# Patient Record
Sex: Female | Born: 1975 | Race: White | Hispanic: No | Smoking: Former smoker
Health system: Southern US, Community
[De-identification: ages and names within clinical notes are randomized; demographics above are authoritative.]

## PROBLEM LIST (undated history)

## (undated) DIAGNOSIS — R7989 Other specified abnormal findings of blood chemistry: Secondary | ICD-10-CM

## (undated) DIAGNOSIS — K219 Gastro-esophageal reflux disease without esophagitis: Secondary | ICD-10-CM

## (undated) DIAGNOSIS — E282 Polycystic ovarian syndrome: Secondary | ICD-10-CM

## (undated) DIAGNOSIS — R945 Abnormal results of liver function studies: Secondary | ICD-10-CM

## (undated) DIAGNOSIS — I10 Essential (primary) hypertension: Secondary | ICD-10-CM

## (undated) HISTORY — DX: Other specified abnormal findings of blood chemistry: R79.89

## (undated) HISTORY — DX: Abnormal results of liver function studies: R94.5

## (undated) HISTORY — DX: Essential (primary) hypertension: I10

## (undated) HISTORY — PX: PARTIAL HYSTERECTOMY: SHX80

---

## 1998-01-05 ENCOUNTER — Other Ambulatory Visit: Admission: RE | Admit: 1998-01-05 | Discharge: 1998-01-05 | Payer: Self-pay | Admitting: Obstetrics and Gynecology

## 1998-02-06 ENCOUNTER — Other Ambulatory Visit: Admission: RE | Admit: 1998-02-06 | Discharge: 1998-02-06 | Payer: Self-pay | Admitting: Obstetrics and Gynecology

## 1998-09-20 ENCOUNTER — Other Ambulatory Visit: Admission: RE | Admit: 1998-09-20 | Discharge: 1998-09-20 | Payer: Self-pay | Admitting: Obstetrics and Gynecology

## 1999-02-07 ENCOUNTER — Ambulatory Visit (HOSPITAL_COMMUNITY): Admission: RE | Admit: 1999-02-07 | Discharge: 1999-02-07 | Payer: Self-pay | Admitting: Obstetrics and Gynecology

## 1999-11-19 ENCOUNTER — Other Ambulatory Visit: Admission: RE | Admit: 1999-11-19 | Discharge: 1999-11-19 | Payer: Self-pay | Admitting: Obstetrics and Gynecology

## 2000-06-04 ENCOUNTER — Other Ambulatory Visit: Admission: RE | Admit: 2000-06-04 | Discharge: 2000-06-04 | Payer: Self-pay | Admitting: Obstetrics and Gynecology

## 2001-04-20 ENCOUNTER — Inpatient Hospital Stay (HOSPITAL_COMMUNITY): Admission: AD | Admit: 2001-04-20 | Discharge: 2001-04-20 | Payer: Self-pay | Admitting: Obstetrics and Gynecology

## 2001-05-29 ENCOUNTER — Inpatient Hospital Stay (HOSPITAL_COMMUNITY): Admission: AD | Admit: 2001-05-29 | Discharge: 2001-05-29 | Payer: Self-pay | Admitting: *Deleted

## 2001-05-30 ENCOUNTER — Encounter (INDEPENDENT_AMBULATORY_CARE_PROVIDER_SITE_OTHER): Payer: Self-pay

## 2001-05-30 ENCOUNTER — Inpatient Hospital Stay (HOSPITAL_COMMUNITY): Admission: AD | Admit: 2001-05-30 | Discharge: 2001-06-02 | Payer: Self-pay | Admitting: *Deleted

## 2001-06-07 ENCOUNTER — Inpatient Hospital Stay (HOSPITAL_COMMUNITY): Admission: AD | Admit: 2001-06-07 | Discharge: 2001-06-07 | Payer: Self-pay | Admitting: Obstetrics and Gynecology

## 2001-06-09 ENCOUNTER — Encounter: Admission: RE | Admit: 2001-06-09 | Discharge: 2001-07-09 | Payer: Self-pay | Admitting: Obstetrics and Gynecology

## 2001-07-06 ENCOUNTER — Other Ambulatory Visit: Admission: RE | Admit: 2001-07-06 | Discharge: 2001-07-06 | Payer: Self-pay | Admitting: Obstetrics and Gynecology

## 2002-03-07 ENCOUNTER — Other Ambulatory Visit: Admission: RE | Admit: 2002-03-07 | Discharge: 2002-03-07 | Payer: Self-pay | Admitting: Obstetrics and Gynecology

## 2002-05-04 ENCOUNTER — Inpatient Hospital Stay (HOSPITAL_COMMUNITY): Admission: AD | Admit: 2002-05-04 | Discharge: 2002-05-04 | Payer: Self-pay | Admitting: Obstetrics and Gynecology

## 2002-07-09 ENCOUNTER — Inpatient Hospital Stay (HOSPITAL_COMMUNITY): Admission: AD | Admit: 2002-07-09 | Discharge: 2002-07-09 | Payer: Self-pay | Admitting: Obstetrics and Gynecology

## 2002-07-11 ENCOUNTER — Inpatient Hospital Stay (HOSPITAL_COMMUNITY): Admission: AD | Admit: 2002-07-11 | Discharge: 2002-07-11 | Payer: Self-pay | Admitting: Obstetrics and Gynecology

## 2002-08-19 ENCOUNTER — Inpatient Hospital Stay (HOSPITAL_COMMUNITY): Admission: AD | Admit: 2002-08-19 | Discharge: 2002-08-19 | Payer: Self-pay | Admitting: Obstetrics and Gynecology

## 2002-08-23 ENCOUNTER — Inpatient Hospital Stay (HOSPITAL_COMMUNITY): Admission: AD | Admit: 2002-08-23 | Discharge: 2002-08-27 | Payer: Self-pay | Admitting: Obstetrics and Gynecology

## 2002-08-28 ENCOUNTER — Encounter: Admission: RE | Admit: 2002-08-28 | Discharge: 2002-09-27 | Payer: Self-pay | Admitting: Obstetrics and Gynecology

## 2002-09-27 ENCOUNTER — Other Ambulatory Visit: Admission: RE | Admit: 2002-09-27 | Discharge: 2002-09-27 | Payer: Self-pay | Admitting: Obstetrics and Gynecology

## 2002-09-28 ENCOUNTER — Encounter: Admission: RE | Admit: 2002-09-28 | Discharge: 2002-10-28 | Payer: Self-pay | Admitting: Obstetrics and Gynecology

## 2002-11-27 ENCOUNTER — Encounter: Admission: RE | Admit: 2002-11-27 | Discharge: 2002-12-27 | Payer: Self-pay | Admitting: Obstetrics and Gynecology

## 2002-12-27 ENCOUNTER — Encounter: Admission: RE | Admit: 2002-12-27 | Discharge: 2003-03-27 | Payer: Self-pay | Admitting: Family Medicine

## 2003-01-27 ENCOUNTER — Encounter: Admission: RE | Admit: 2003-01-27 | Discharge: 2003-02-26 | Payer: Self-pay | Admitting: Obstetrics and Gynecology

## 2003-03-29 ENCOUNTER — Encounter: Admission: RE | Admit: 2003-03-29 | Discharge: 2003-04-28 | Payer: Self-pay | Admitting: Obstetrics and Gynecology

## 2003-07-17 ENCOUNTER — Inpatient Hospital Stay (HOSPITAL_COMMUNITY): Admission: AD | Admit: 2003-07-17 | Discharge: 2003-07-19 | Payer: Self-pay | Admitting: *Deleted

## 2003-11-06 ENCOUNTER — Other Ambulatory Visit: Admission: RE | Admit: 2003-11-06 | Discharge: 2003-11-06 | Payer: Self-pay | Admitting: *Deleted

## 2004-01-02 ENCOUNTER — Ambulatory Visit (HOSPITAL_COMMUNITY): Admission: RE | Admit: 2004-01-02 | Discharge: 2004-01-02 | Payer: Self-pay | Admitting: Obstetrics and Gynecology

## 2004-07-01 ENCOUNTER — Emergency Department (HOSPITAL_COMMUNITY): Admission: EM | Admit: 2004-07-01 | Discharge: 2004-07-01 | Payer: Self-pay | Admitting: Emergency Medicine

## 2005-02-27 ENCOUNTER — Other Ambulatory Visit: Admission: RE | Admit: 2005-02-27 | Discharge: 2005-02-27 | Payer: Self-pay | Admitting: *Deleted

## 2009-05-22 ENCOUNTER — Inpatient Hospital Stay (HOSPITAL_COMMUNITY): Admission: EM | Admit: 2009-05-22 | Discharge: 2009-05-27 | Payer: Self-pay | Admitting: Emergency Medicine

## 2009-05-25 ENCOUNTER — Ambulatory Visit: Payer: Self-pay | Admitting: Infectious Diseases

## 2010-02-21 DIAGNOSIS — R87612 Low grade squamous intraepithelial lesion on cytologic smear of cervix (LGSIL): Secondary | ICD-10-CM | POA: Insufficient documentation

## 2010-05-18 HISTORY — PX: ABDOMINAL HYSTERECTOMY: SHX81

## 2010-05-27 ENCOUNTER — Ambulatory Visit (HOSPITAL_COMMUNITY): Admission: RE | Admit: 2010-05-27 | Discharge: 2010-05-28 | Payer: Self-pay | Admitting: Obstetrics and Gynecology

## 2010-05-27 ENCOUNTER — Encounter (INDEPENDENT_AMBULATORY_CARE_PROVIDER_SITE_OTHER): Payer: Self-pay | Admitting: Obstetrics and Gynecology

## 2010-10-30 LAB — CBC
HCT: 33.3 % — ABNORMAL LOW (ref 36.0–46.0)
HCT: 42.3 % (ref 36.0–46.0)
Hemoglobin: 11.4 g/dL — ABNORMAL LOW (ref 12.0–15.0)
Hemoglobin: 14.6 g/dL (ref 12.0–15.0)
MCH: 30.7 pg (ref 26.0–34.0)
MCH: 30.8 pg (ref 26.0–34.0)
MCHC: 34.4 g/dL (ref 30.0–36.0)
MCHC: 34.5 g/dL (ref 30.0–36.0)
MCV: 88.9 fL (ref 78.0–100.0)
MCV: 89.4 fL (ref 78.0–100.0)
Platelets: 229 10*3/uL (ref 150–400)
Platelets: 284 10*3/uL (ref 150–400)
RBC: 3.72 MIL/uL — ABNORMAL LOW (ref 3.87–5.11)
RBC: 4.76 MIL/uL (ref 3.87–5.11)
RDW: 13.2 % (ref 11.5–15.5)
RDW: 13.3 % (ref 11.5–15.5)
WBC: 13.1 10*3/uL — ABNORMAL HIGH (ref 4.0–10.5)
WBC: 8.6 10*3/uL (ref 4.0–10.5)

## 2010-10-30 LAB — SURGICAL PCR SCREEN
MRSA, PCR: NEGATIVE
Staphylococcus aureus: NEGATIVE

## 2010-10-30 LAB — PREGNANCY, URINE: Preg Test, Ur: NEGATIVE

## 2010-11-07 ENCOUNTER — Encounter: Payer: Self-pay | Admitting: Gastroenterology

## 2010-11-14 NOTE — Letter (Signed)
Summary: New Patient letter  [facility]  [address] [place], [place] [address]   Phone: [phone]  Fax: [phone]       11/07/2010 MRN: [id number]  [facility] [address] [place], [place]  [address]  Dear Ms. Laruth Bouchard,  Welcome to the Gastroenterology Division at [facility].    You are scheduled to see Dr.  Christella Hartigan on 12-18-10 at 3:30pm on the 3rd floor at [facility], [address]  We ask that you try to arrive at our office 15 minutes prior to your appointment time to allow for check-in.  We would like you to complete the enclosed self-administered evaluation form prior to your visit and bring it with you on the day of your appointment.  We will review it with you.  Also, please bring a complete list of all your medications or, if you prefer, bring the medication bottles and we will list them.  Please bring your insurance card so that we may make a copy of it.  If your insurance requires a referral to see a specialist, please bring your referral form from your primary care physician.  Co-payments are due at the time of your visit and may be paid by cash, check or credit card.     Your office visit will consist of a consult with your physician (includes a physical exam), any laboratory testing he/she may order, scheduling of any necessary diagnostic testing (e.g. x-ray, ultrasound, CT-scan), and scheduling of a procedure (e.g. Endoscopy, Colonoscopy) if required.  Please allow enough time on your schedule to allow for any/all of these possibilities.    If you cannot keep your appointment, please call [phone] to cancel or reschedule prior to your appointment date.  This allows [place] the opportunity to schedule an appointment for another patient in need of care.  If you do not cancel or reschedule by 5 p.m. the business day prior to your appointment date, you will be charged a $50.00 late cancellation/no-show fee.    Thank you for choosing [facility] for your medical needs.  We appreciate the opportunity to care for you.  Please visit [place] at our website  to learn more about our practice.                     Sincerely,                                                             The Gastroenterology Division

## 2010-11-21 LAB — CBC
HCT: 35.4 % — ABNORMAL LOW (ref 36.0–46.0)
HCT: 40.5 % (ref 36.0–46.0)
HCT: 41.9 % (ref 36.0–46.0)
Hemoglobin: 12.1 g/dL (ref 12.0–15.0)
Hemoglobin: 13.2 g/dL (ref 12.0–15.0)
Hemoglobin: 14.1 g/dL (ref 12.0–15.0)
MCHC: 33.6 g/dL (ref 30.0–36.0)
MCHC: 34.1 g/dL (ref 30.0–36.0)
MCHC: 34.6 g/dL (ref 30.0–36.0)
MCV: 89.6 fL (ref 78.0–100.0)
MCV: 90.3 fL (ref 78.0–100.0)
MCV: 90.5 fL (ref 78.0–100.0)
Platelets: 269 10*3/uL (ref 150–400)
Platelets: 286 10*3/uL (ref 150–400)
RBC: 3.91 MIL/uL (ref 3.87–5.11)
RBC: 4.28 MIL/uL (ref 3.87–5.11)
RBC: 4.52 MIL/uL (ref 3.87–5.11)
RBC: 4.64 MIL/uL (ref 3.87–5.11)
RDW: 13.4 % (ref 11.5–15.5)
WBC: 11.2 10*3/uL — ABNORMAL HIGH (ref 4.0–10.5)
WBC: 16.3 10*3/uL — ABNORMAL HIGH (ref 4.0–10.5)
WBC: 8.3 10*3/uL (ref 4.0–10.5)

## 2010-11-21 LAB — BASIC METABOLIC PANEL
BUN: 11 mg/dL (ref 6–23)
BUN: 6 mg/dL (ref 6–23)
CO2: 24 mEq/L (ref 19–32)
CO2: 28 mEq/L (ref 19–32)
Calcium: 8.8 mg/dL (ref 8.4–10.5)
Calcium: 8.8 mg/dL (ref 8.4–10.5)
Chloride: 103 mEq/L (ref 96–112)
Chloride: 104 mEq/L (ref 96–112)
Creatinine, Ser: 0.74 mg/dL (ref 0.4–1.2)
Creatinine, Ser: 0.77 mg/dL (ref 0.4–1.2)
GFR calc Af Amer: >60 mL/min (ref >60)
GFR calc Af Amer: >60 mL/min (ref >60)
GFR calc Af Amer: >60 mL/min (ref >60)
GFR calc non Af Amer: >60 mL/min (ref >60)
GFR calc non Af Amer: >60 mL/min (ref >60)
Glucose, Bld: 102 mg/dL — ABNORMAL HIGH (ref 70–99)
Potassium: 3.8 mEq/L (ref 3.5–5.1)
Potassium: 4.3 mEq/L (ref 3.5–5.1)
Sodium: 136 mEq/L (ref 135–145)
Sodium: 136 mEq/L (ref 135–145)
Sodium: 138 mEq/L (ref 135–145)

## 2010-11-21 LAB — ANTI-NUCLEAR AB-TITER (ANA TITER)

## 2010-11-21 LAB — CSF CULTURE W GRAM STAIN: Culture: NO GROWTH

## 2010-11-21 LAB — DIFFERENTIAL
Basophils Absolute: 0 10*3/uL (ref 0.0–0.1)
Basophils Relative: 0 % (ref 0–1)
Eosinophils Absolute: 0.1 10*3/uL (ref 0.0–0.7)
Eosinophils Relative: 1 % (ref 0–5)
Lymphocytes Relative: 9 % — ABNORMAL LOW (ref 12–46)
Lymphs Abs: 1 10*3/uL (ref 0.7–4.0)
Monocytes Absolute: 0.5 10*3/uL (ref 0.1–1.0)
Monocytes Relative: 5 % (ref 3–12)
Neutro Abs: 9.6 10*3/uL — ABNORMAL HIGH (ref 1.7–7.7)
Neutrophils Relative %: 85 % — ABNORMAL HIGH (ref 43–77)

## 2010-11-21 LAB — CSF CELL COUNT WITH DIFFERENTIAL
Eosinophils, CSF: 0 % (ref 0–1)
Eosinophils, CSF: 0 % (ref 0–1)
Lymphs, CSF: 91 % — ABNORMAL HIGH (ref 40–80)
Lymphs, CSF: 97 % — ABNORMAL HIGH (ref 40–80)
Monocyte-Macrophage-Spinal Fluid: 3 % — ABNORMAL LOW (ref 15–45)
Monocyte-Macrophage-Spinal Fluid: 7 % — ABNORMAL LOW (ref 15–45)
Other Cells, CSF: 0
Other Cells, CSF: 0
RBC Count, CSF: 16 /mm3 — ABNORMAL HIGH
RBC Count, CSF: 625 /mm3 — ABNORMAL HIGH
Segmented Neutrophils-CSF: 0 % (ref 0–6)
Segmented Neutrophils-CSF: 2 % (ref 0–6)
Tube #: 1
Tube #: 4
WBC, CSF: 179 /mm3 — ABNORMAL HIGH (ref 0–5)
WBC, CSF: 640 /mm3 — ABNORMAL HIGH (ref 0–5)

## 2010-11-21 LAB — URINALYSIS, ROUTINE W REFLEX MICROSCOPIC
Bilirubin Urine: NEGATIVE
Glucose, UA: NEGATIVE mg/dL
Hgb urine dipstick: NEGATIVE
Ketones, ur: NEGATIVE mg/dL
Nitrite: NEGATIVE
Protein, ur: NEGATIVE mg/dL
Specific Gravity, Urine: 1.014 (ref 1.005–1.030)
Urobilinogen, UA: 0.2 mg/dL (ref 0.0–1.0)
pH: 8 (ref 5.0–8.0)

## 2010-11-21 LAB — CRYPTOCOCCAL ANTIGEN, CSF

## 2010-11-21 LAB — PREGNANCY, URINE: Preg Test, Ur: NEGATIVE

## 2010-11-21 LAB — HSV PCR
HSV 2 , PCR: NOT DETECTED
HSV, PCR: NOT DETECTED

## 2010-11-21 LAB — GLUCOSE, CSF: Glucose, CSF: 48 mg/dL (ref 43–76)

## 2010-11-21 LAB — MISCELLANEOUS TEST

## 2010-11-21 LAB — PATHOLOGIST SMEAR REVIEW

## 2010-11-21 LAB — ROCKY MTN SPOTTED FVR AB, IGG-BLOOD: RMSF IgG: 0.3 IV

## 2010-11-21 LAB — ROCKY MTN SPOTTED FVR AB, IGM-BLOOD: RMSF IgM: 0.45 IV (ref 0.00–0.89)

## 2010-11-21 LAB — PROTEIN, CSF: Total  Protein, CSF: 129 mg/dL — ABNORMAL HIGH (ref 15–45)

## 2010-11-21 LAB — HIV-1 RNA QUANT-NO REFLEX-BLD
HIV 1 RNA Quant: <48 copies/mL (ref <48)
HIV-1 RNA Quant, Log: <1.68 {Log} (ref <1.68)

## 2010-12-18 ENCOUNTER — Other Ambulatory Visit: Payer: Self-pay | Admitting: Gastroenterology

## 2010-12-18 ENCOUNTER — Other Ambulatory Visit (INDEPENDENT_AMBULATORY_CARE_PROVIDER_SITE_OTHER): Payer: BC Managed Care – PPO

## 2010-12-18 ENCOUNTER — Ambulatory Visit (INDEPENDENT_AMBULATORY_CARE_PROVIDER_SITE_OTHER): Payer: BC Managed Care – PPO | Admitting: Gastroenterology

## 2010-12-18 ENCOUNTER — Encounter: Payer: Self-pay | Admitting: Gastroenterology

## 2010-12-18 VITALS — BP 136/84 | HR 90 | Ht 66.0 in | Wt 200.0 lb

## 2010-12-18 DIAGNOSIS — R7989 Other specified abnormal findings of blood chemistry: Secondary | ICD-10-CM

## 2010-12-18 LAB — CBC WITH DIFFERENTIAL/PLATELET
Basophils Absolute: 0 10*3/uL (ref 0.0–0.1)
HCT: 40.3 % (ref 36.0–46.0)
Lymphs Abs: 2 10*3/uL (ref 0.7–4.0)
MCV: 88.3 fl (ref 78.0–100.0)
Monocytes Absolute: 0.5 10*3/uL (ref 0.1–1.0)
Neutrophils Relative %: 63.6 % (ref 43.0–77.0)
Platelets: 344 10*3/uL (ref 150.0–400.0)
RDW: 13.6 % (ref 11.5–14.6)
WBC: 7.5 10*3/uL (ref 4.5–10.5)

## 2010-12-18 LAB — COMPREHENSIVE METABOLIC PANEL
ALT: 87 U/L — ABNORMAL HIGH (ref 0–35)
AST: 48 U/L — ABNORMAL HIGH (ref 0–37)
Albumin: 4.2 g/dL (ref 3.5–5.2)
Alkaline Phosphatase: 61 U/L (ref 39–117)
Potassium: 3.9 mEq/L (ref 3.5–5.1)
Sodium: 140 mEq/L (ref 135–145)
Total Bilirubin: 0.9 mg/dL (ref 0.3–1.2)
Total Protein: 7.1 g/dL (ref 6.0–8.3)

## 2010-12-18 LAB — IRON AND TIBC
%SAT: 12 % — ABNORMAL LOW (ref 20–55)
Iron: 45 ug/dL (ref 42–145)
TIBC: 363 ug/dL (ref 250–470)
UIBC: 318 ug/dL

## 2010-12-18 NOTE — Patient Instructions (Addendum)
We will get records from Dr. Ricki Miller (hepatitis panel, ANA recently) You will get labs drawn today:  total iron, ferritin, TIBC, ANA, AMA, anti smooth muscle antibody, alpha 1 antitrypsin, cerulloplasm, celiac sprue panel, total IgA level, CBC, CMET, INR. You will be set up for an ultrasound.  [facility] 12/23/10 arrive 7:45 am nothing to eat or drink after midnight. You may need EGD, colonoscopy depending on the workup above. We will be testing you for celiac sprue.

## 2010-12-18 NOTE — Progress Notes (Signed)
HPI: This is a  very is a 35 year old woman  Recently found to have elevated liver tests (AST was 57, ALT was 126. The rest of her liver tests as well as amylase and lipase were all normal. Her CBC was completely normal. Repeat check of liver tests showed a similar pattern about one week later.. Never had jaundice, hepatitis.  Never had blood transfusion.  No liver disease in family.  Never used IV drugs.  She has gained 30 pounds in past year, unintentionally.  Has post prandial bloating, discomfort for many years.   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 She had upper and lower endoscopy by Dr. Juanda Chance 10-12 years ago. Those charts are off site and not available for review at this time    Was given pancreatic enzymes recently, didn't make a difference.  She stopped them.   she has chronically loose stools, 4-5 a day. She has never seen blood in her stool   Review of systems: Pertinent positive and negative review of systems were noted in the above HPI section.  All other review of systems was otherwise negative.   Past Medical History, Past Surgical History, Family History, Social History, Current Medications, Allergies were all reviewed with the patient via [facility] electronic medical record system.   Physical Exam: BP 136/84  Pulse 90  Ht 5\' 6"  (1.676 m)  Wt 200 lb (90.719 kg)  BMI 32.28 kg/m2 Constitutional: generally well-appearing Psychiatric: alert and oriented x3 Eyes: extraocular movements intact Mouth: oral pharynx moist, no lesions Neck: supple no lymphadenopathy Cardiovascular: heart regular rate and rhythm Lungs: clear to auscultation bilaterally Abdomen: soft, nontender, nondistended, no obvious ascites, no peritoneal signs, normal bowel sounds Extremities: no lower extremity edema bilaterally Skin: no lesions on visible extremities    Assessment and plan: 35 y.o. female withElevated liver tests, postprandial bloating, chronic loose  stools  She tells me she had hepatitis panel done by her primary care physician we will get those results sent over. I will also send her for other blood work to check for other causes of elevated transaminases. I suspect that she has fatty liver disease especially given her 30 pound weight gain in the past 6 months. She has chronic postprandial bloating, chronic loose stools and perhaps these are signs of underlying celiac sprue which is also known to cause elevated liver tests. I will include a celiac panel and her workup. She might end up needing upper and lower endoscopies depending on the above workup. She will get an abdominal ultrasound for her liver tests as well.

## 2010-12-19 LAB — GLIA (IGA/G) + TTG IGA: Tissue Transglutaminase Ab, IgA: 6.3 U/mL (ref <20)

## 2010-12-19 LAB — ALPHA-1-ANTITRYPSIN: A-1 Antitrypsin, Ser: 94 mg/dL (ref 90–200)

## 2010-12-23 ENCOUNTER — Ambulatory Visit (HOSPITAL_COMMUNITY)
Admission: RE | Admit: 2010-12-23 | Discharge: 2010-12-23 | Disposition: A | Discharge status: Home / Self Care | Payer: BC Managed Care – PPO | Source: Ambulatory Visit | Attending: Gastroenterology | Admitting: Gastroenterology

## 2010-12-23 DIAGNOSIS — R141 Gas pain: Secondary | ICD-10-CM | POA: Insufficient documentation

## 2010-12-23 DIAGNOSIS — R7989 Other specified abnormal findings of blood chemistry: Secondary | ICD-10-CM | POA: Insufficient documentation

## 2010-12-23 DIAGNOSIS — R142 Eructation: Secondary | ICD-10-CM | POA: Insufficient documentation

## 2010-12-23 DIAGNOSIS — R143 Flatulence: Secondary | ICD-10-CM | POA: Insufficient documentation

## 2010-12-31 ENCOUNTER — Telehealth: Payer: Self-pay | Admitting: Gastroenterology

## 2010-12-31 DIAGNOSIS — R7989 Other specified abnormal findings of blood chemistry: Secondary | ICD-10-CM

## 2010-12-31 NOTE — Telephone Encounter (Signed)
Lab tests dated March, 2012;  Hepatitis a antibody, IgM was negative. Hepatitis B surface antigen was negative. Hepatitis B core antibody IgM was negative. Hepatitis C antibody was negative.   Please call her overall from reviewing her lab tests and her ultrasound I do think that she has fatty liver disease especially given her 30 pound weight in the past 6 months or so. She needs to try to lose this weight, slowly and gradually over the next year or 2. I would like to get a repeat set of LFTs in 2 months. If they show a significant increase from their current level then we might need to consider liver biopsy.

## 2010-12-31 NOTE — Telephone Encounter (Signed)
Pt aware she will have labs in July.

## 2011-01-01 ENCOUNTER — Telehealth: Payer: Self-pay | Admitting: Gastroenterology

## 2011-01-01 NOTE — Telephone Encounter (Signed)
Pt wanted to know if she should start the pancreatic enzymes I read her note to her that Dr Christella Hartigan has she told him they did not work so she stopped.  If they dont work she doesn't need to take them.  He did not advise her to restart

## 2011-01-03 NOTE — Op Note (Signed)
[facility]  Patient:    Amanda Stokes, Amanda Stokes Visit Number: [id number] MRN: [id number]          Service Type: OBS Location: 910A 9101 01 Attending Physician:  Donne Hazel Dictated by:   Guy Sandifer Arleta Creek, M.D. Proc. Date: 05/31/01 Admit Date:  05/30/2001                             Operative Report  DELIVERY NOTE  OBSTETRICIAN:                 Guy Sandifer. Arleta Creek, M.D.  DESCRIPTION OF PROCEDURE:     The patient made steady progress in the second stage.  The baby was in the occiput anterior position.  A second-degree midline episiotomy was performed.  She then had spontaneous delivery of the vertex.  The oropharynx and nasopharynx were then bulb suctioned.  A nuchal cord x 1 was reduced.  There was a mild shoulder dystocia treated with McRoberts position and suprapubic pressure, which resolved without a great deal of trouble.  The infant was then delivered.  The cord was clamped and cut and the infant was taken to the warming table.  Heart rate remained good and the infant responded well to stimulation.  A viable female infant was noted with Apgars of 7 and 9.  The placenta was intact with three vessels.  Birth weight was 9 lb 0 oz.  Arterial cord pH of 7.26 was noted.  The second-degree midline episiotomy which went down to the capsule of the rectal sphincter, but not through the muscle was repaired in layers.  There was thin meconium noted after delivery of the baby in the after coming fluid.  The oropharynx and nasopharynx were thoroughly bulb suctioned.  The patient and infant were stable in the labor and delivery room. Dictated by:   Guy Sandifer Arleta Creek, M.D. Attending Physician:  Donne Hazel DD:  05/31/01 TD:  05/31/01 Job: [id number]

## 2011-01-03 NOTE — Discharge Summary (Signed)
NAME:  VERTA, RIEDLINGER                             ACCOUNT NO.:  [id number]   MEDICAL RECORD NO.:  [id number]                   PATIENT TYPE:  INP   LOCATION:  9306                                 [facility]ILITY[facility]   PHYSICIAN:  Duke Salvia. Marcelle Overlie, M.D.            DATE OF BIRTH:  09/28/1975   DATE OF ADMISSION:  07/17/2003  DATE OF DISCHARGE:                                 DISCHARGE SUMMARY   DISCHARGE DIAGNOSIS:  Acute abdominal pain, probable viral syndrome,  possible mesenteric lymphadenitis.   SUMMARY OF THE ADMISSION HISTORY AND PHYSICAL EXAMINATION:  Please see  admission H&P for details.  Briefly, a 35 year old G2 P1 with one-day  history of right lower quadrant abdominal pain associated with nausea and  vomiting.   HOSPITAL COURSE:  The patient was admitted for observation after CT scan  showed no evidence of acute process in the abdomen, in particular no  evidence of appendicitis.  She does have an IUD and STD culture was obtained  and she was started on IV Unasyn as a precaution although her presentation  was not necessarily consistent with PID.  She was hydrated and given IV pain  medicines along with Zofran.  Her white count following the day had  decreased from 19,000 to 9.2; on the day of discharge - July 19, 2003 -  was 4.8 with a hemoglobin of 11.3.  Her diet was advanced.  On the day of  discharge she was afebrile, her pain was much improved, her abdominal exam  was unremarkable, she was hungry and tolerating a regular diet at that point  and ready for discharge.  She did have an episode the day prior to discharge  of diarrhea; some of these symptoms consistent with a mesenteric  lymphadenitis or gastroenteritis.   OTHER LABORATORY DATA:  CMET on admission:  Potassium 3.2, glucose 105,  calcium 7.9, albumin 3.2, total bilirubin 2.1, amylase was low at 18,  alkaline phosphatase was 34.  STD culture is pending.   DISPOSITION:  The patient discharged on Darvocet-N 100  one or two p.o. q.4-  6h. p.r.n., Zofran 8 mg ODT one p.o. q.6-8h. for nausea.  Advised to push  p.o. fluids at home.  We will call her to come into our office next week for  follow-up exam.   CONDITION:  Good.   ACTIVITY:  Normal.                                               Duke Salvia. Marcelle Overlie, M.D.    RMH/MEDQ  D:  07/19/2003  T:  07/19/2003  Job:  [id number]

## 2011-01-03 NOTE — Op Note (Signed)
[facility]  Patient:    Amanda Stokes Visit Number: [id number] MRN: [id number]          Service Type: DSU Location: [facility] Attending Physician:  Rhina Brackett Proc. Date: 02/07/99 Admit Date:  02/07/1999                             Operative Report  PREOPERATIVE DIAGNOSIS:       Chronic pelvic pain.  POSTOPERATIVE DIAGNOSIS:      Chronic pelvic pain.  PROCEDURE:                    Diagnostic laparoscopy.  SURGEON:                      Duke Salvia. Marcelle Overlie, M.D.  ANESTHESIA:                   General endotracheal.  COMPLICATIONS:                None.  DRAINS:                       In-and-out Foley catheter.  BLOOD LOSS:                   Less than 5 cc.  PROCEDURE AND FINDINGS:       Patient was taken to the operating room and after an adequate level of general endotracheal anesthesia was obtained, with the legs in stirrups, the abdomen, perineum and vagina were prepped and draped in the usual  manner for a laparoscopy.  Bladder was drained and EUA carried.  Uterus was midposition, normal size, mobile.  Adnexa negative.  Hulka tenaculum was positioned and attention directed to the abdomen where a 2-cm subumbilical incision was made and the Veress needle was introduced without difficulty; its intra-abdominal position was verified by pressure and water-testing.  After a two-liter pneumoperitoneum was then created, laparoscopic trocar and sleeve were then inserted without difficulty.  There was no evidence of any bleeding or trauma. Two to three fingerbreadths above the symphysis in the midline, a second puncture was made under direct visualization and a blunt probe was inserted.  With the uterus anteflexed and the patient in Trendelenburg, the pelvic findings are as follows:                                The anterior and posterior cul-de-sac were unremarkable.  The uterosacral ligaments were well-defined.  There was  no evidence of any endometriosis or scarring.  The uterus itself was normal size; the serosa was normal.  Tubes and ovaries were normal on either side.  No evidence of any endometriosis or peri-adnexal adhesions.  The appendix, cecum, liver edge, gallbladder, upper abdomen were unremarkable; no abnormalities that I could ascertain.  These findings were photo-documented.  Instruments were removed; gas allowed to escape.  The defects were closed with 4-0 Dexon subcuticular sutures. She tolerated this well and went to recovery room in good condition. Attending Physician:  Rhina Brackett DD:  02/07/99 TD:  02/07/99 Job: [id number]

## 2011-01-03 NOTE — H&P (Signed)
NAME:  Amanda Stokes, Amanda Stokes                             ACCOUNT NO.:  [id number]   MEDICAL RECORD NO.:  [id number]                   PATIENT TYPE:  INP   LOCATION:  9306                                 [facility]ACILITY:  [facility]   PHYSICIAN:  Duke Salvia. Marcelle Overlie, M.D.            DATE OF BIRTH:  May 08, 1976   DATE OF ADMISSION:  07/17/2003  DATE OF DISCHARGE:                                HISTORY & PHYSICAL   CHIEF COMPLAINT:  Acute abdominal pain.   HISTORY OF PRESENT ILLNESS:  A 35 year old G2 P1 who has an IUD, had acute  onset the day of admission of mid and right lower quadrant pain associated  with significant nausea.  She was seen in our office earlier today where SPT  was negative, WBC was 19,000.  Pelvic ultrasound was done in the office that  showed the IUD in place in the uterus, there was no hydronephrosis, no  gallstones noted, several small follicle cysts noted with a small amount of  free fluid.  No other abnormalities.  Her exam was most consistent with  possible appendicitis and she was admitted for CT scan and management of  pain and nausea.   In triage CT did not demonstrate any evidence of appendicitis, there were no  other acute abnormalities, she was treated with IV Toradol and Zofran and  admitted overnight for further observation.   PAST MEDICAL HISTORY:  1. Allergies:  VICODIN.  2. Operations:  None.  3. Review of systems:  She had cryo in 1999, had two vaginal deliveries at     term, placement of an IUD.  She did have a laparoscopy in 2000.   FAMILY HISTORY:  Mother with lupus otherwise unremarkable.   PHYSICAL EXAMINATION:  VITAL SIGNS:  Temperature 97.3, BP 120/72.  HEENT:  Unremarkable.  NECK:  Supple without masses.  LUNGS:  Clear.  CARDIOVASCULAR:  Regular rate and rhythm without murmurs, rubs, gallops  noted.  BREASTS:  Without masses.  ABDOMEN:  Soft, flat, was tender in the right lower quadrant with minimal  rebound.  PELVIC:  Vagina and cervix normal, the IUD  string was noted, there was no  unusual discharge, the uterus itself was slightly tender anteriorly, left  adnexa unremarkable, tender on the right side without masses or nodularity  noted.  EXTREMITIES/NEUROLOGIC:  Unremarkable.   IMPRESSION:  Acute pelvic pain, rule out appendicitis, other possibilities  include pelvic inflammatory disease, kidney stone although her urine was  clear.   PLAN:  We will admit for further observation, CT scan, and management of her  pain and nausea.                                              Richard M. Marcelle Overlie, M.D.   RMH/MEDQ  D:  07/18/2003  T:  07/18/2003  Job:  [id number]

## 2011-01-03 NOTE — Discharge Summary (Signed)
NAME:  Amanda Stokes, Amanda Stokes                             ACCOUNT NO.:  [id number]   MEDICAL RECORD NO.:  [id number]                   PATIENT TYPE:  INP   LOCATION:  9103                                 [facility]   PHYSICIAN:  Freddy Finner, M.D.                DATE OF BIRTH:  1976/04/21   DATE OF ADMISSION:  08/23/2002  DATE OF DISCHARGE:  08/27/2002                                 DISCHARGE SUMMARY   ADMITTING DIAGNOSES:  1. Intrauterine pregnancy at term.  2. Macrosomia.   DISCHARGE DIAGNOSES:  1. Status post low transverse cesarean section.  2. Viable female infant.   PROCEDURE:  Primary low transverse cesarean section.   REASON FOR ADMISSION:  Please see written H&P.   HOSPITAL COURSE:  The patient was a 35 year old gravida 2, para 1 that was  admitted to [facility] at term for an induction of labor  for fetal macrosomia.  The patient had had history of a previous delivery of  a spontaneous vaginal delivery of a 9 pound infant with shoulder dystocia.  On the morning of the admission artificial rupture of membranes was  performed which revealed clear fluid.  Fetal heart tones were reassuring.  Low dose Pitocin was given to augment labor.  Intrauterine pressure catheter  was placed for adequate assessment of labor.  Epidural was placed for  patient's comfort.  The patient did progress to 5 cm dilated.  With adequate  labor, cervix without change, and fetal caput noted, decision was made to  proceed with a cesarean delivery.  The patient was taken to the operating  room where epidural was dosed to an adequate surgical level.  A low  transverse incision was made with the delivery of a viable female infant  weighing 8 pounds 5 ounces with Apgars of 8 at one minute, 9 at five  minutes.  The patient tolerated procedure well and was taken to the recovery  room in stable condition.  On postoperative day one patient had good return  of bowel function.  Abdomen was  soft.  Abdominal dressing was clean, dry,  and intact.  Fundus was firm and nontender.  Laboratories revealed  hemoglobin of 10.1, platelet count of 216,000, WBC count of 9.3.  On  postoperative day two patient complained of some low abdominal tenderness  with dysuria.  She denied flank pain.  Vital signs were stable and patient  was afebrile.  Abdomen was soft.  Fundus was firm and nontender.  Incision  was noted to be clean, dry, and intact.  Clean catch urine was obtained.  On  postoperative day three patient was doing well.  She was tolerating a  regular diet without complaints of nausea and vomiting.  Fundus was firm and  nontender.  Incision was clean, dry, and intact.  Clean catch urine was  negative and patient desired to stay an additional  night.  On postoperative  day four patient was doing well.  Abdomen was soft.  Incision was clean,  dry, and intact.  Staples were removed and patient was discharged home.   CONDITION ON DISCHARGE:  Good.   DIET:  Regular, as tolerated.   ACTIVITY:  No heavy lifting.  No driving x2 weeks.  No vaginal entry.   FOLLOW UP:  She is to follow up in the office in one to two weeks for an  incision check.  She is to call for temperature greater than 100 degrees,  persistent nausea and vomiting, heavy vaginal bleeding, and/or redness or  drainage from the incisional site.   DISCHARGE MEDICATIONS:  1. Demerol 50 mg numbers 30 one p.o. q.4-6h. p.r.n. pain.  2. Ibuprofen 600 mg q.6h. p.r.n.  3. Prenatal vitamins one p.o. daily.  4. Zoloft 50 mg one p.o. daily.  5. Colace one p.o. daily p.r.n.     Julio Sicks, N.P.                        Freddy Finner, M.D.    CC/MEDQ  D:  09/23/2002  T:  09/23/2002  Job:  [id number]

## 2011-01-03 NOTE — Op Note (Signed)
NAME:  Amanda Stokes, Amanda Stokes                             ACCOUNT NO.:  [id number]   MEDICAL RECORD NO.:  [id number]                   PATIENT TYPE:  INP   LOCATION:  9103                                 [facility]   PHYSICIAN:  Michelle L. Vincente Poli, M.D.            DATE OF BIRTH:  Jun 14, 1976   DATE OF PROCEDURE:  08/23/2002  DATE OF DISCHARGE:                                 OPERATIVE REPORT   PREOPERATIVE DIAGNOSES:  1. Intrauterine pregnancy at term.  2. Macrosomia.  3. Arrest of dilation at 5 cm.   POSTOPERATIVE DIAGNOSES:  1. Intrauterine pregnancy at term.  2. Macrosomia.  3. Arrest of dilation at 5 cm.   PROCEDURE:  Primary low transverse cesarean section.   SURGEON:  Michelle L. Vincente Poli, M.D.   ANESTHESIA:  Epidural with local.   ESTIMATED BLOOD LOSS:  500 cubic centimeters.   FINDINGS:  Female infant in transverse position.  Apgars 8 at one minute, 9  at five minutes.  Weight 8 pounds 5 ounces.   PROCEDURE:  The patient was taken to the operating room.  Epidural was  dosed.  She did have some sensation on the left side which was resolved  after some local was placed on the left side near where the incision was  made.  A low transverse incision was made and carried down to the fascia.  The fascia was scored in the midline, extended laterally.  A Pfannenstiel  incision was then developed and then the rectus muscles were separated in  the midline.  The peritoneum was then entered bluntly and the peritoneal  incision was then extended.  The bladder blade was inserted in the lower  uterine segment.  Bladder flap was created sharply and then digitally and  the bladder flap was then developed.  The bladder blade was readjusted.  A  low transverse incision was made with the scalpel.  The uterus was entered  using a hemostat.  The baby was in cephalic presentation, was in a  transverse position.  Was a female infant with Apgars 8 at one minute and 9  at five minutes.  There was a  shoulder cord noted at the time of delivery  which was loose.  The baby was delivered without any difficulty and the cord  was clamped and cut.  The baby was handed to the waiting pediatricians.  The  cord blood was obtained.  The placenta was manually removed and noted to be  intact and normal in appearance.  The uterus was cleared of all clots and  debris.  Was then exteriorized and the uterine incision was closed using 0  chromic in a continuous running locked stitch and was inspected and noted to  be hemostatic.  Adnexa were inspected and noted to be normal.  The uterus  was returned to the abdomen.  The incision was then reinspected and noted to  be  hemostatic.  The peritoneum was closed using 0 Vicryl in a continuous  running stitch and the rectus muscles were reapproximated using the same 0  Vicryl.  The  fascia was closed with 0 Vicryl in a continuous running locked stitch  starting in each corner and meeting in midline.  After irrigation of the  subcutaneous layer the skin was closed with staples.  All sponge, lap, and  instrument counts were correct x2.  The patient tolerated procedure well and  went to recovery room in stable condition.                                               Michelle L. Vincente Poli, M.D.    Florestine Avers  D:  08/23/2002  T:  08/23/2002  Job:  [id number]

## 2011-01-06 ENCOUNTER — Encounter: Payer: Self-pay | Admitting: Gastroenterology

## 2011-03-03 ENCOUNTER — Telehealth: Payer: Self-pay

## 2011-03-03 NOTE — Telephone Encounter (Signed)
Pt reminded to have labs done this week, she states she will be in this afternoon

## 2011-03-03 NOTE — Telephone Encounter (Signed)
Message copied by Donata Duff on Mon Mar 03, 2011  9:13 AM ------      Message from: Donata Duff      Created: Tue Dec 31, 2010  4:27 PM       Pt to get labs

## 2011-03-05 ENCOUNTER — Other Ambulatory Visit (INDEPENDENT_AMBULATORY_CARE_PROVIDER_SITE_OTHER): Payer: BC Managed Care – PPO

## 2011-03-05 DIAGNOSIS — R7989 Other specified abnormal findings of blood chemistry: Secondary | ICD-10-CM

## 2011-03-05 LAB — HEPATIC FUNCTION PANEL
ALT: 87 U/L — ABNORMAL HIGH (ref 0–35)
AST: 49 U/L — ABNORMAL HIGH (ref 0–37)
Bilirubin, Direct: 0.2 mg/dL (ref 0.0–0.3)
Total Bilirubin: 1.1 mg/dL (ref 0.3–1.2)

## 2011-03-13 ENCOUNTER — Telehealth: Payer: Self-pay

## 2011-03-13 DIAGNOSIS — K760 Fatty (change of) liver, not elsewhere classified: Secondary | ICD-10-CM

## 2011-03-13 NOTE — Telephone Encounter (Signed)
Pt aware and will be in on 8/23 for labs and 8/24 for ROV

## 2011-03-13 NOTE — Telephone Encounter (Signed)
Message copied by Donata Duff on Thu Mar 13, 2011  8:57 AM ------      Message from: Rob Bunting P      Created: Wed Mar 12, 2011  1:09 PM       She needs rov and lfts in 4-5 weeks.            Thanks

## 2011-04-10 ENCOUNTER — Other Ambulatory Visit (INDEPENDENT_AMBULATORY_CARE_PROVIDER_SITE_OTHER): Payer: BC Managed Care – PPO

## 2011-04-10 ENCOUNTER — Telehealth: Payer: Self-pay

## 2011-04-10 DIAGNOSIS — K760 Fatty (change of) liver, not elsewhere classified: Secondary | ICD-10-CM

## 2011-04-10 DIAGNOSIS — K7689 Other specified diseases of liver: Secondary | ICD-10-CM

## 2011-04-10 LAB — HEPATIC FUNCTION PANEL
ALT: 134 U/L — ABNORMAL HIGH (ref 0–35)
Total Protein: 7 g/dL (ref 6.0–8.3)

## 2011-04-10 NOTE — Telephone Encounter (Signed)
Message copied by Donata Duff on Thu Apr 10, 2011  8:36 AM ------      Message from: Donata Duff      Created: Thu Mar 13, 2011  9:12 AM       Pt to get labs

## 2011-04-10 NOTE — Telephone Encounter (Signed)
Pt reminded to have labs

## 2011-04-11 ENCOUNTER — Encounter: Payer: Self-pay | Admitting: Gastroenterology

## 2011-04-11 ENCOUNTER — Ambulatory Visit (INDEPENDENT_AMBULATORY_CARE_PROVIDER_SITE_OTHER): Payer: BC Managed Care – PPO | Admitting: Gastroenterology

## 2011-04-11 VITALS — BP 132/88 | HR 72 | Ht 66.0 in | Wt 199.0 lb

## 2011-04-11 DIAGNOSIS — K3189 Other diseases of stomach and duodenum: Secondary | ICD-10-CM

## 2011-04-11 DIAGNOSIS — R7989 Other specified abnormal findings of blood chemistry: Secondary | ICD-10-CM | POA: Insufficient documentation

## 2011-04-11 DIAGNOSIS — R1013 Epigastric pain: Secondary | ICD-10-CM

## 2011-04-11 DIAGNOSIS — E669 Obesity, unspecified: Secondary | ICD-10-CM

## 2011-04-11 NOTE — Progress Notes (Signed)
Review of pertinent gastrointestinal problems: 1. elevated transaminases: Noted some her 2012 (AST and ALT 50-80 range) lab workup: Hepatitis a antibody, IgM was negative. Hepatitis B surface antigen was negative. Hepatitis B core antibody IgM was negative. Hepatitis C antibody was negative.  antismooth muscle Ab negative.  Ceruloplasmin level normal, alpha-1 antitrypsin level normal, iron studies normal, celiac sprue testing negative, ANA negative, AMA positive.  Ultrasound showed fatty liver may 2012. Patient had gained 30 pounds in a very brief time.   HPI: This is a  very pleasant 35 year old woman whom I last saw about 3 months ago.  She has since then had a battery of blood tests and ultrasound imaging.  She has postprandial bloating, abd discomfort.  Has 5-6 loose stools a day.  She stays "so bloated and in abd discomfort" all day.    This has been a problem for a very long time. She had upper and lower endoscopy 10-12 years ago and tells me that those were both normal.    Review of systems: Pertinent positive and negative review of systems were noted in the above HPI section.  All other review of systems was otherwise negative.   Past Medical History  Diagnosis Date  . Elevated LFTs     Past Surgical History  Procedure Date  . Partial hysterectomy   . Cesarean section      reports that she has quit smoking. She has never used smokeless tobacco. She reports that she does not drink alcohol or use illicit drugs.  family history includes Diabetes in her maternal grandmother; Heart disease in her maternal grandfather; and Kidney disease in her paternal grandmother.    Current Medications, Allergies were all reviewed with the patient via [facility] electronic medical record system.    Physical Exam: BP 132/88  Pulse 72  Ht 5\' 6"  (1.676 m)  Wt 199 lb (90.266 kg)  BMI 32.12 kg/m2 Constitutional: generally well-appearing Psychiatric: alert and oriented x3 Eyes:  extraocular movements intact Mouth: oral pharynx moist, no lesions Neck: supple no lymphadenopathy Cardiovascular: heart regular rate and rhythm Lungs: clear to auscultation bilaterally Abdomen: soft, nontender, nondistended, no obvious ascites, no peritoneal signs, normal bowel sounds Extremities: no lower extremity edema bilaterally Skin: no lesions on visible extremities    Assessment and plan: 35 y.o. female with elevated liver tests, postprandial abdominal discomfort chronic  She has lost only 1 pound since her last visit. I do suspect that her underlying problem with her liver is fatty liver disease. We cannot ignore the elevated anti-mitochondrial antibody level however. I recommended liver biopsy since her liver tests are increasing. She will be referred to a dietitian for general weight loss strategies and we will proceed with an EGD for her chronic dyspepsia.

## 2011-04-11 NOTE — Patient Instructions (Addendum)
Liver biopsy with interventional radiology for elevated liver tests, (+AMA level) You will be set up for an upper endoscopy for dyspepsia, bloating. Dietician referral for general weight loss strategies. A copy of this information will be made available to Dr. Ricki Miller.

## 2011-04-14 ENCOUNTER — Telehealth: Payer: Self-pay

## 2011-04-14 NOTE — Telephone Encounter (Signed)
Pt has been scheduled and notified of liver biopsy for 04/17/11 at 10 am

## 2011-04-17 ENCOUNTER — Ambulatory Visit (HOSPITAL_COMMUNITY): Payer: BC Managed Care – PPO

## 2011-04-17 ENCOUNTER — Ambulatory Visit (HOSPITAL_COMMUNITY)
Admission: RE | Admit: 2011-04-17 | Discharge: 2011-04-17 | Disposition: A | Discharge status: Home / Self Care | Payer: BC Managed Care – PPO | Source: Ambulatory Visit | Attending: Gastroenterology | Admitting: Gastroenterology

## 2011-04-17 ENCOUNTER — Other Ambulatory Visit: Payer: Self-pay | Admitting: Diagnostic Radiology

## 2011-04-17 DIAGNOSIS — R748 Abnormal levels of other serum enzymes: Secondary | ICD-10-CM | POA: Insufficient documentation

## 2011-04-17 DIAGNOSIS — R7989 Other specified abnormal findings of blood chemistry: Secondary | ICD-10-CM | POA: Insufficient documentation

## 2011-04-17 LAB — PROTIME-INR: INR: 0.97 (ref 0.00–1.49)

## 2011-04-17 LAB — CBC
MCH: 29 pg (ref 26.0–34.0)
Platelets: 290 10*3/uL (ref 150–400)
RBC: 4.66 MIL/uL (ref 3.87–5.11)
RDW: 13.3 % (ref 11.5–15.5)
WBC: 7.3 10*3/uL (ref 4.0–10.5)

## 2011-04-17 LAB — APTT: aPTT: 32 seconds (ref 24–37)

## 2011-04-22 ENCOUNTER — Telehealth: Payer: Self-pay | Admitting: Gastroenterology

## 2011-04-22 NOTE — Telephone Encounter (Signed)
I spoke with her about the biopsy results, it looks like she indeed has fatty liver disease. I suspect the ama positive test was a red herring.  She has not yet heard from dietitian referral for weight loss advice and we will check on that again.

## 2011-04-22 NOTE — Telephone Encounter (Signed)
Spoke with Dietician's ofc at [phone] to ask about pt's pending referral placed on 04/11/2011. They will call back. lmom notifying pt of this.

## 2011-04-22 NOTE — Telephone Encounter (Signed)
Dr Christella Hartigan, I routed the path to you; please advise. Thanks.

## 2011-04-23 NOTE — Telephone Encounter (Signed)
Checked with pt and the Dietician called her today.

## 2011-04-25 ENCOUNTER — Encounter: Payer: Self-pay | Admitting: Gastroenterology

## 2011-04-25 ENCOUNTER — Ambulatory Visit (AMBULATORY_SURGERY_CENTER): Payer: BC Managed Care – PPO | Admitting: Gastroenterology

## 2011-04-25 VITALS — Temp 98.9°F | Resp 18 | Ht 66.0 in | Wt 199.0 lb

## 2011-04-25 DIAGNOSIS — K2990 Gastroduodenitis, unspecified, without bleeding: Secondary | ICD-10-CM

## 2011-04-25 DIAGNOSIS — K2970 Gastritis, unspecified, without bleeding: Secondary | ICD-10-CM

## 2011-04-25 DIAGNOSIS — K2900 Acute gastritis without bleeding: Secondary | ICD-10-CM

## 2011-04-25 DIAGNOSIS — R7989 Other specified abnormal findings of blood chemistry: Secondary | ICD-10-CM

## 2011-04-25 MED ORDER — SODIUM CHLORIDE 0.9 % IV SOLN: 500.0000 mL | INTRAVENOUS | Status: DC

## 2011-04-25 NOTE — Patient Instructions (Signed)
Discharge instructions given with verbal understanding. Handout on gastritis and a soft diet given. Resume previous medications.

## 2011-04-28 ENCOUNTER — Telehealth: Payer: Self-pay | Admitting: *Deleted

## 2011-04-28 NOTE — Telephone Encounter (Signed)

## 2011-05-01 ENCOUNTER — Encounter: Payer: BC Managed Care – PPO | Attending: Gastroenterology | Admitting: *Deleted

## 2011-05-01 ENCOUNTER — Telehealth: Payer: Self-pay | Admitting: Gastroenterology

## 2011-05-01 ENCOUNTER — Encounter: Payer: Self-pay | Admitting: *Deleted

## 2011-05-01 DIAGNOSIS — R7989 Other specified abnormal findings of blood chemistry: Secondary | ICD-10-CM

## 2011-05-01 DIAGNOSIS — Z713 Dietary counseling and surveillance: Secondary | ICD-10-CM | POA: Insufficient documentation

## 2011-05-01 DIAGNOSIS — E669 Obesity, unspecified: Secondary | ICD-10-CM | POA: Insufficient documentation

## 2011-05-01 NOTE — Progress Notes (Signed)
  Medical Nutrition Therapy:  Appt start time: 1500 end time:  1600.   Assessment:  Primary concerns today: obesity/weight management and fatty liver disease. Amanda Stokes reports that she has been recently diagnosed with fatty liver disease. She is concerned with her liver function tests and weight and reports that she has gained 25-30 lbs in the past year. She reports low energy levels, fatigue and swelling after eating, constant diarrhea, and pain after eating. Pt believes she may have some type of GI disorder. Pt had endoscopy last week and she does not yet have results yet.  MEDICATIONS: Not taking any medication   DIETARY INTAKE:  Usual eating pattern includes 2-3 meals and 1-2 snacks per day.  24-hr recall:  B (AM): Coffee w/ Splenda, Flavored coffee creamer (2T)  Snk (AM): N/A  OR Yogurt cup OR pc. Fresh fruit L (12:30-1 PM): Roast beef sandwich, mayo, mustard, Pepperjack cheese OR leftover Snk (PM): N/A D (5:30-6:30 PM): Spaghetti OR Sloppy Joe's OR Mac & Cheese OR Baked chicken w/ vegetable Pizza OR Hamburger Helper Snk (PM): N/A *Uses 80/20 Ground Beef OR Venison @ Restaurants: McDonalds Grilled Chicken Club OR Wendy's Taco Salad OR [place] food OR Steak houses (Steak, sweet potato, salad)  Beverages: Water, Coffee, Juices, Crystal Light, Dr. Reino Kent, Sweet Tea  Usual physical activity: Inactive  Estimated energy needs: 1600 calories 180 g carbohydrates 80-100 g protein 50 g fat <18 g saturated fat  Progress Towards Goal(s):  In progress.   Nutritional Diagnosis:  [place]-2.1 Inpaired nutrition utilization As related to fatty liver disease/elevated fat and concentrated sweet intake.  As evidenced by pt with elevated liver function tests.    Intervention:  Nutrition education.  Handouts given during visit include:  Low-Fat Diet Guidelines  Plate Method Meal Planner  Sugar-sweetened beverage handout  Monitoring/Evaluation:  Dietary intake, exercise, lab values, and body  weight prn.

## 2011-05-01 NOTE — Patient Instructions (Signed)
Goals:  Eat 3 meals/day, Avoid meal skipping   Eat every 3-5 hours  Increase protein rich foods  Follow "Plate Method" for portion/carbohydrate control  Choose more whole grains, lean protein, low-fat dairy, and fruits/non-starchy vegetables.   Aim for >30 min of physical activity daily  Limit sugar-sweetened beverages and concentrated sweets  Avoid sugar-sweetened beverages  Increase fluids to 50-64 oz/day  Limit total fat to <50g, saturated fat <15g per day

## 2011-05-01 NOTE — Telephone Encounter (Signed)
Left message on machine to call back  

## 2011-05-01 NOTE — Telephone Encounter (Signed)
Patty, please call her.  Biopsies showed no H. Pylori.  She needs HIDA scan of GB to estimate GB ejection fraction, check for biliary dyskinesia.   Dorene Grebe, no results letter is needed

## 2011-05-05 ENCOUNTER — Other Ambulatory Visit: Payer: Self-pay | Admitting: Gastroenterology

## 2011-05-05 NOTE — Telephone Encounter (Signed)
Left message on machine to call back letter mailed. 

## 2011-05-05 NOTE — Progress Notes (Signed)
[facility] 05/15/11 1 pm arrive 1245 pm nothing 6 hours prior  Pt aware

## 2011-05-15 ENCOUNTER — Encounter (HOSPITAL_COMMUNITY): Payer: Self-pay

## 2011-05-15 ENCOUNTER — Encounter (HOSPITAL_COMMUNITY)
Admission: RE | Admit: 2011-05-15 | Discharge: 2011-05-15 | Disposition: A | Discharge status: Home / Self Care | Payer: BC Managed Care – PPO | Source: Ambulatory Visit | Attending: Gastroenterology | Admitting: Gastroenterology

## 2011-05-15 DIAGNOSIS — R109 Unspecified abdominal pain: Secondary | ICD-10-CM | POA: Insufficient documentation

## 2011-05-15 MED ORDER — SINCALIDE 5 MCG IJ SOLR: 0.0200 ug/kg | Freq: Once | INTRAMUSCULAR | Status: DC

## 2011-05-15 MED ORDER — TECHNETIUM TC 99M MEBROFENIN IV KIT
5.5000 | PACK | Freq: Once | INTRAVENOUS | Status: AC | PRN
  Administered 2011-05-15: 5.5 via INTRAVENOUS

## 2011-05-16 ENCOUNTER — Other Ambulatory Visit: Payer: Self-pay | Admitting: Gastroenterology

## 2011-05-19 ENCOUNTER — Telehealth: Payer: Self-pay

## 2011-05-19 NOTE — Telephone Encounter (Signed)
Pt aware.

## 2011-05-19 NOTE — Telephone Encounter (Signed)
Message copied by Donata Duff on Mon May 19, 2011  9:26 AM ------      Message from: Marnette Burgess      Created: Mon May 19, 2011  8:45 AM      Regarding: Appt      Contact: [phone]       Patient is scheduled to see Dr. Emelia Loron on 06/02/11 @ 9:10AM, arrival 8:40AM.  If you have any questions please call.            Thank You,      Elane Fritz      ----- Message -----         From: Chales Abrahams, CMA         Sent: 05/16/2011   1:33 PM           To: Marnette Burgess            referral to general surgery to consider cholecystectomy.  Dr Donell Beers, [address] or [place]

## 2011-06-02 ENCOUNTER — Ambulatory Visit (INDEPENDENT_AMBULATORY_CARE_PROVIDER_SITE_OTHER): Payer: BC Managed Care – PPO | Admitting: General Surgery

## 2011-06-02 ENCOUNTER — Encounter (INDEPENDENT_AMBULATORY_CARE_PROVIDER_SITE_OTHER): Payer: Self-pay | Admitting: General Surgery

## 2011-06-02 VITALS — BP 120/82 | HR 78 | Temp 98.6°F | Resp 24 | Ht 67.0 in | Wt 191.0 lb

## 2011-06-02 DIAGNOSIS — K828 Other specified diseases of gallbladder: Secondary | ICD-10-CM

## 2011-06-02 DIAGNOSIS — R7989 Other specified abnormal findings of blood chemistry: Secondary | ICD-10-CM

## 2011-06-02 NOTE — Progress Notes (Signed)
Chief Complaint  Patient presents with  . Abdominal Pain    Eval gallbladder    HPI Amanda Stokes is a 35 y.o. female.  Referred by Dr. 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 She has had most years of abdominal pain and bloating after she eats. Her normal routine is 5-6 bowel movements a day for years. She was noted to have some elevated liver function tests and was referred to Dr. Christella Hartigan. This is then evaluated it appears this is from a fatty liver. She complains of bloating every time she eats as well as bilateral upper quadrant pain after she eats it goes away on its own after a couple of hours. She has gained about 25 pounds over the last year without any real change in her diet or exercise. She describes no nausea or vomiting. She describes no fevers. She has no history of any jaundice. She has been on a low-fat diet for the fatty liver disease and she does have a decrease in her pain the bloating but it is still present. She has really a normal abdominal ultrasound. She has a HIDA scan that has a borderline ejection fraction but had some significant right upper quadrant pain after she had the injection. She comes in today to discuss possible cholecystectomy.  Past Medical History  Diagnosis Date  . Elevated LFTs     Past Surgical History  Procedure Date  . Partial hysterectomy   . Cesarean section   . Abdominal hysterectomy 05/2010    partial    Family History  Problem Relation Age of Onset  . Diabetes Maternal Grandmother     also Maternal Grandfather  . Heart disease Maternal Grandfather     and Father  . Kidney disease Paternal Grandmother     Social History History  Substance Use Topics  . Smoking status: Former Smoker    Quit date: 06/01/2009  . Smokeless tobacco: Never Used  . Alcohol Use: No    Allergies  Allergen Reactions  . Vicodin (Hydrocodone-Acetaminophen) Itching    No current outpatient  prescriptions on file.    Review of Systems Review of Systems  Gastrointestinal: Positive for abdominal pain and abdominal distention.  All other systems reviewed and are negative.    Blood pressure 120/82, pulse 78, temperature 98.6 F (37 C), temperature source Temporal, resp. rate 24, height 5\' 7"  (1.702 m), weight 191 lb (86.637 kg).  Physical Exam Physical Exam  Constitutional: She appears well-developed and well-nourished.  Eyes: No scleral icterus.  Neck: Neck supple.  Cardiovascular: Normal rate, regular rhythm and normal heart sounds.   Pulmonary/Chest: Effort normal and breath sounds normal. She has no wheezes. She has no rales.  Abdominal: Soft. There is tenderness (mild ruq tenderness).  Lymphadenopathy:    She has no cervical adenopathy.    Data Reviewed NUCLEAR MEDICINE HEPATOBILIARY IMAGING WITH GALLBLADDER EF  Technique: Sequential images of the abdomen were obtained out to  60 minutes following intravenous administration of  radiopharmaceutical. After slow intravenous infusion of 1.7  micrograms Cholecystokinin, gallbladder ejection fraction was  determined.  Radiopharmaceutical: 5.5 mCi Tc-55m Choletec  Comparison: Ultrasound abdomen 12/23/2010.  Findings: There is symmetric uptake in the liver and prompt  excretion into the biliary tree which is visualized by 10 minutes.  The gallbladder is visualized at . Activity is noted in  the small bowel at 15 minutes.  The patient received a protocoled infusion of CCK and  a gallbladder  ejection fraction was estimated at 37.6%. Normal is greater than  30%.  The patient did experience symptoms during CCK infusion.  IMPRESSION:  1. Normal biliary patency study.  2. Low normal gallbladder ejection fraction at 37%.  3. Abdominal pain with CCK infusion.   Assessment    Biliary dyskinesia    Plan    We had a long discussion about her symptoms and the possibility that this is gallbladder disease. Some  of her symptoms are certainly referable to her gallbladder but they could be from a variety of other sources as well. She has essentially a normal ultrasound just a fatty liver. Her liver function tests are likely related to that as well. She has a HIDA scan which shows a very borderline ejection fraction but she does describe some significant right upper quadrant pain with the injection at that time. I discussed the possibility of a laparoscopic cholecystectomy with her. We discussed the risks including bleeding, infection, open procedure, common bile duct injury. The main thing we discussed his relief of her symptoms. I think that there is a a chance we may make her symptoms better to some degree of are not really sure how much better going to make them. Some of the symptoms are certainly possibly related to her gallbladder but we may not relieve her symptoms by removing her gallbladder. After a long discussion she would like to try to undergo cholecystectomy to see if this would help her symptoms. She does understand that this may either not help her symptoms or may relieve them to some degree but not completely. I think it is reasonable to proceed as she did have symptoms with injection during her HIDA scan.       Berlie Persky 06/02/2011, 9:39 AM

## 2011-06-06 ENCOUNTER — Other Ambulatory Visit (INDEPENDENT_AMBULATORY_CARE_PROVIDER_SITE_OTHER): Payer: Self-pay | Admitting: General Surgery

## 2011-06-06 ENCOUNTER — Encounter (HOSPITAL_COMMUNITY): Payer: BC Managed Care – PPO

## 2011-06-06 LAB — DIFFERENTIAL
Basophils Absolute: 0.1 10*3/uL (ref 0.0–0.1)
Eosinophils Absolute: 0.1 10*3/uL (ref 0.0–0.7)
Eosinophils Relative: 1 % (ref 0–5)
Lymphocytes Relative: 24 % (ref 12–46)
Lymphs Abs: 2.3 10*3/uL (ref 0.7–4.0)
Neutrophils Relative %: 70 % (ref 43–77)

## 2011-06-06 LAB — COMPREHENSIVE METABOLIC PANEL
AST: 25 U/L (ref 0–37)
Albumin: 4.1 g/dL (ref 3.5–5.2)
Alkaline Phosphatase: 77 U/L (ref 39–117)
CO2: 29 mEq/L (ref 19–32)
Chloride: 101 mEq/L (ref 96–112)
GFR calc non Af Amer: >90 mL/min (ref >90)
Potassium: 3.6 mEq/L (ref 3.5–5.1)
Total Bilirubin: 0.7 mg/dL (ref 0.3–1.2)

## 2011-06-06 LAB — CBC
HCT: 43.6 % (ref 36.0–46.0)
MCV: 88.6 fL (ref 78.0–100.0)
Platelets: 340 10*3/uL (ref 150–400)
RBC: 4.92 MIL/uL (ref 3.87–5.11)
RDW: 13 % (ref 11.5–15.5)
WBC: 9.6 10*3/uL (ref 4.0–10.5)

## 2011-06-10 ENCOUNTER — Other Ambulatory Visit (INDEPENDENT_AMBULATORY_CARE_PROVIDER_SITE_OTHER): Payer: Self-pay | Admitting: General Surgery

## 2011-06-10 ENCOUNTER — Ambulatory Visit (HOSPITAL_COMMUNITY)
Admission: RE | Admit: 2011-06-10 | Discharge: 2011-06-10 | Disposition: A | Discharge status: Home / Self Care | Payer: BC Managed Care – PPO | Source: Ambulatory Visit | Attending: General Surgery | Admitting: General Surgery

## 2011-06-10 DIAGNOSIS — Z01812 Encounter for preprocedural laboratory examination: Secondary | ICD-10-CM | POA: Insufficient documentation

## 2011-06-10 DIAGNOSIS — Z8701 Personal history of pneumonia (recurrent): Secondary | ICD-10-CM | POA: Insufficient documentation

## 2011-06-10 DIAGNOSIS — K811 Chronic cholecystitis: Secondary | ICD-10-CM

## 2011-06-10 DIAGNOSIS — Z87891 Personal history of nicotine dependence: Secondary | ICD-10-CM | POA: Insufficient documentation

## 2011-06-10 DIAGNOSIS — R109 Unspecified abdominal pain: Secondary | ICD-10-CM | POA: Insufficient documentation

## 2011-06-10 HISTORY — PX: CHOLECYSTECTOMY: SHX55

## 2011-06-11 NOTE — Op Note (Signed)
Amanda Stokes, Amanda Stokes                   ACCOUNT NO.:  [id number]  MEDICAL RECORD NO.:  [id number]  LOCATION:  DAYL                         FACILITY[facility]  [facility]  PHYSICIAN:  Juanetta Gosling, MDDATE OF BIRTH:  Aug 31, 1975  DATE OF PROCEDURE:  06/10/2011 DATE OF DISCHARGE:                              OPERATIVE REPORT   PREOPERATIVE DIAGNOSIS:  Biliary dyskinesia.  POSTOPERATIVE DIAGNOSIS:  Biliary dyskinesia.  PROCEDURE:  Laparoscopic cholecystectomy.  SURGEON:  Juanetta Gosling, MD  ASSISTANT:  None.  ANESTHESIA:  General.  SPECIMENS:  Gallbladder and contents to Pathology.  ESTIMATED BLOOD LOSS:  50 cc.  COMPLICATIONS:  None.  DRAINS:  None.  DISPOSITION:  Patient to recovery room in stable condition.  INDICATION:  A 35 year old female who has had a variety of different abdominal complaints for a while who has a normal ultrasound as well as a HIDA scan with a borderline ejection fraction, but had pain with CCK injection.  She was referred to me for evaluation of her cholecystectomy. She and I had a long conversation and I told her that I was not entirely sure if we were going to make her symptoms better with a cholecystectomy.  One option was to just observe her, but she feels that this pain has been going on long enough and with the abnormality if there is any chance that this could make her better, she would like to undergo cholecystectomy.  She fully understands that this may not cure her symptoms, so we discussed a laparoscopic cholecystectomy as well as the risks and benefits associated with that.  PROCEDURE:  After informed consent was obtained, patient was taken to the operating room.  She was administered 1 g of intravenous cefoxitin. Sequential compression devices were placed on lower extremities.  She was placed under general endotracheal anesthesia without complication. Her abdomen was then prepped and draped in a standard sterile surgical fashion.  A  surgical time-out was then performed.  I anesthetized below her umbilicus with 0.25% Marcaine.  I then entered her old incision transversely.  I then grasped the umbilical stalk. Following this, I made an incision in her fascia with an 11 blade.  I then entered the peritoneum bluntly.  A 0 Vicryl pursestring suture was then placed through the fascia.  I then introduced a Hasson trocar and insufflated the abdomen at 15 mmHg pressure.  I then inserted three further 5-mm trocars in the epigastrium and right upper quadrant after infiltration with local anesthetic under direct vision without complication.  I then retracted her gallbladder cephalad and lateral. She had a very intrahepatic gallbladder and there indeed was evidence that her gallbladder had had some issues as it was scarred in. Eventually, I was able to dissect out the triangle of Calot in its entirety and obtained a critical view of safety.  Her cystic duct was fairly short, so I elected not to perform a cholangiogram.  Her preoperative laboratory showed only one of her transaminases mildly elevated.  I then clipped the cystic duct and then divided it.  I then clipped the artery.  There was a small branch of the artery that upon doing this, this bled accounting  for about 50 cc of blood loss.  I then clipped this and divided it and this was completely controlled.  I then removed the gallbladder from the liver bed, which was somewhat difficult due to the fact that it was intrahepatic.  I then eventually was able to remove this.  I placed this in EndoCatch, removed it from the umbilicus. I then obtained hemostasis on the liver.  I did place a piece of Surgicel SNoW in the liver bed as well.  I then removed my Hasson trocar and tied this down.  I viewed this from the epigastric port.  There was no evidence of an entry injury.  I then placed an additional 0 Vicryl figure-of-eight suture through the fascia to close the umbilical  defect. I then desufflated the abdomen and removed all trocars.  I closed this with 4-0 Monocryl and placed Dermabond on the incision.  She tolerated this well, was extubated and transferred to the recovery room in stable condition.     Juanetta Gosling, MD     MCW/MEDQ  D:  06/10/2011  T:  06/10/2011  Job:  [id number]  cc:   Rachael Fee, MD [address] [place], [place] [address]  Juline Patch, M.D. Fax: [phone]  Electronically Signed by Emelia Loron MD on 06/11/2011 03:42:34 PM

## 2011-07-14 ENCOUNTER — Encounter (INDEPENDENT_AMBULATORY_CARE_PROVIDER_SITE_OTHER): Payer: Self-pay | Admitting: General Surgery

## 2011-07-14 ENCOUNTER — Ambulatory Visit (INDEPENDENT_AMBULATORY_CARE_PROVIDER_SITE_OTHER): Payer: BC Managed Care – PPO | Admitting: General Surgery

## 2011-07-14 VITALS — BP 134/86 | HR 64 | Temp 96.6°F | Resp 18 | Ht 67.0 in | Wt 192.0 lb

## 2011-07-14 DIAGNOSIS — Z09 Encounter for follow-up examination after completed treatment for conditions other than malignant neoplasm: Secondary | ICD-10-CM

## 2011-07-14 NOTE — Progress Notes (Signed)
Subjective:     Patient ID: Amanda Stokes, female   DOB: [birth year], 35 y.o.   MRN: [id number]  HPI This is a 35 year old female who sounded like she had biliary dyskinesia. I recently did a laparoscopic cholecystectomy. She feels much better since then her complaints of her pain as well as her bloating are better since her operation. She is having a couple loose bowel movements per day but is otherwise very happy with her results at this point. She is returned to most of her normal activity also.  Review of Systems     Objective:   Physical Exam Well healed incisions without infection    Assessment:     S/p lap chole    Plan:        Her pathology showed chronic cholecystitis and cholesterolosis. She's had a good result so far. I told her that the loose bowel movements should continue to get better if these persist to let me know the next month or 2. Otherwise she is released to full normal activity and I will see her back as needed.

## 2011-10-08 IMAGING — US US BIOPSY
1 series · 13 of 15 positions shown · non-contrast
Comparison: none

Clinical: Elevated liver functions.

ULTRASOUND-GUIDED RANDOM CORE LIVER BIOPSY:
An ultrasound guided liver biopsy was thoroughly discussed with the
patient and questions were answered. The benefits, risks,
alternatives, and complications were also discussed. The patient
understands and wishes to proceed with the procedure. A verbal as
well as written consent was obtained.
Ultrasound imaging of the liver was performed and an appropriate
skin entry site was determined. Skin site was marked, prepped and
draped in the usual sterile fashion. 1% Lidocaine was infiltrated
locally. A 17 gauge Trocar needle was advanced under ultrasound
guidance into the liver. Imaging was obtained documenting
appropriate needle position.  Three coaxial 18 gauge core samples
were then obtained and sent to the laboratory for further analysis.
Post procedure scans demonstrate no evidence of bleeding or
hematoma.  The patient tolerated the procedure well with no
immediate complication.
Cardiac and respiratory monitoring was provided by the radiology
RN.  Moderate sedation in the form of 200 mcg fentanyl and 2 mg of
versed were administered throughout the procedure for a total of 15
minutes.

[Series 1: us biopsy · 0.32mm/px · 13 of 15 slices shown]
[im 1/15]
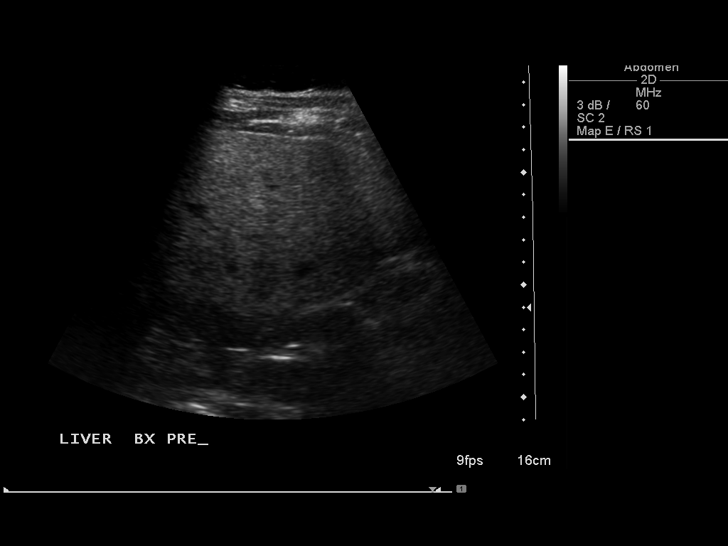
[im 2/15]
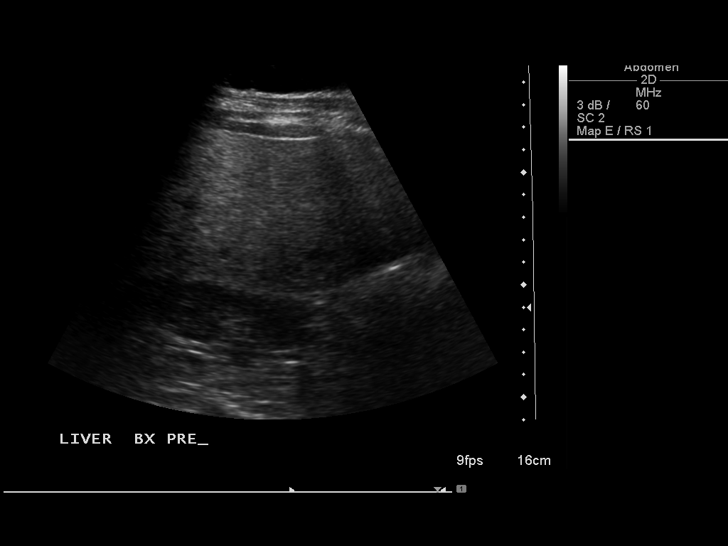
[im 3/15]
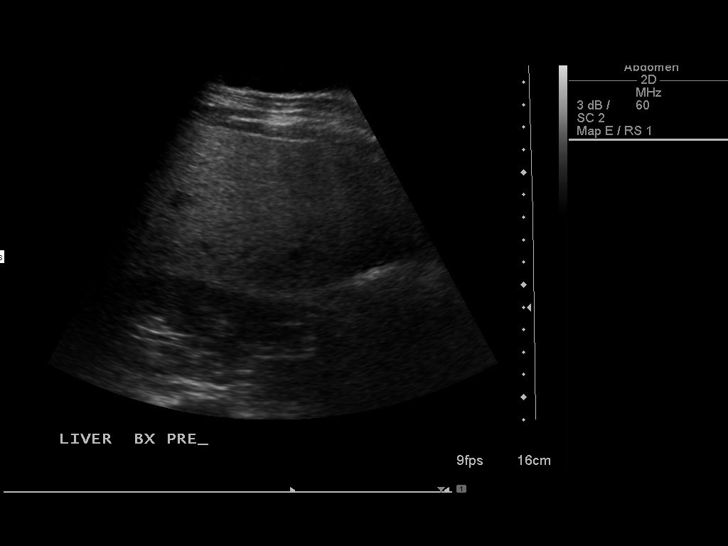
[im 5/15]
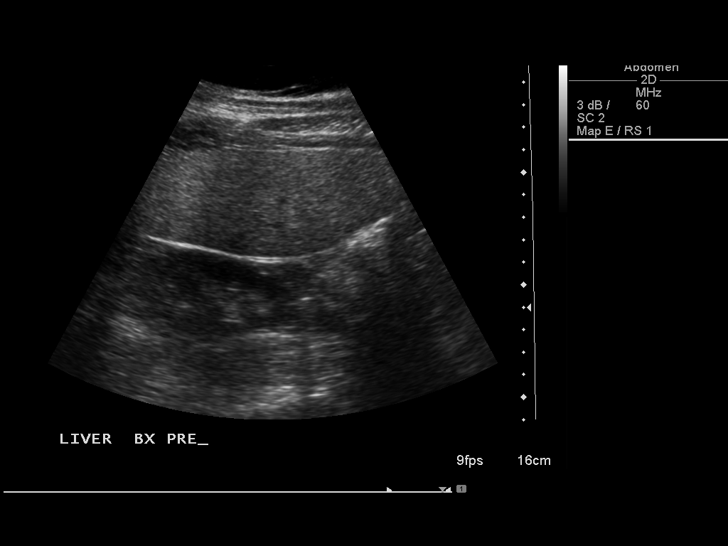
[im 6/15]
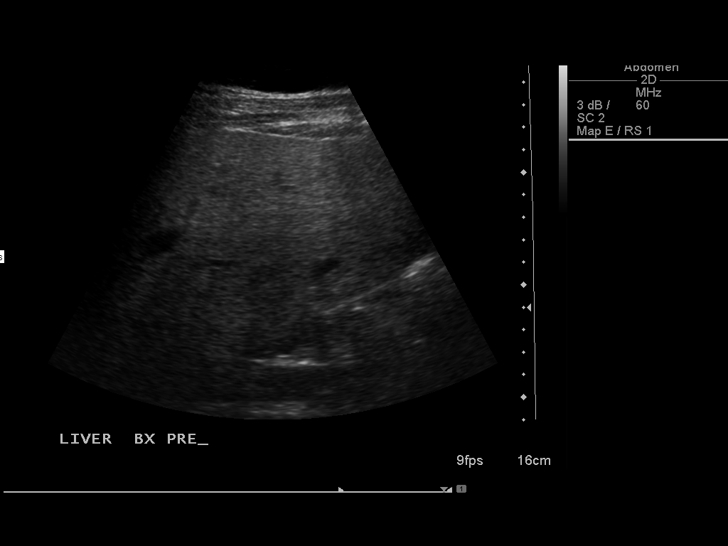
[im 7/15]
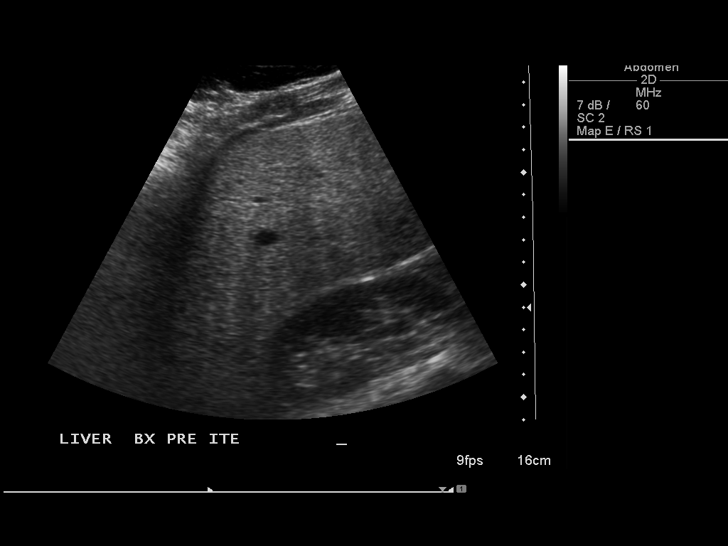
[im 8/15]
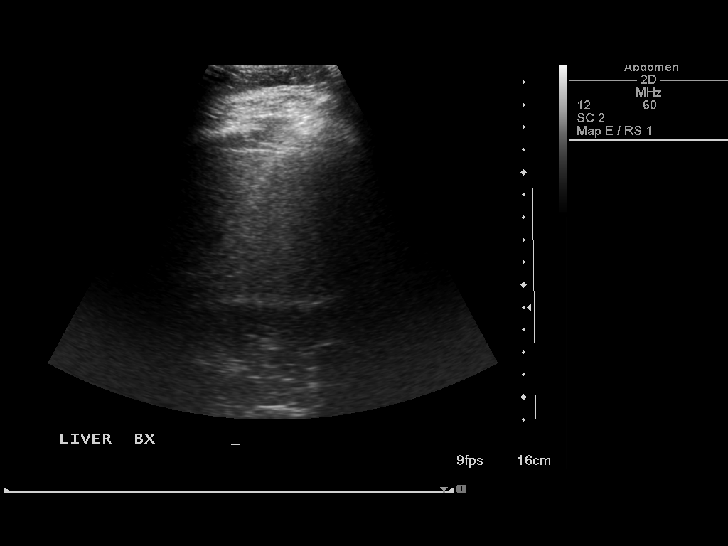
[im 9/15]
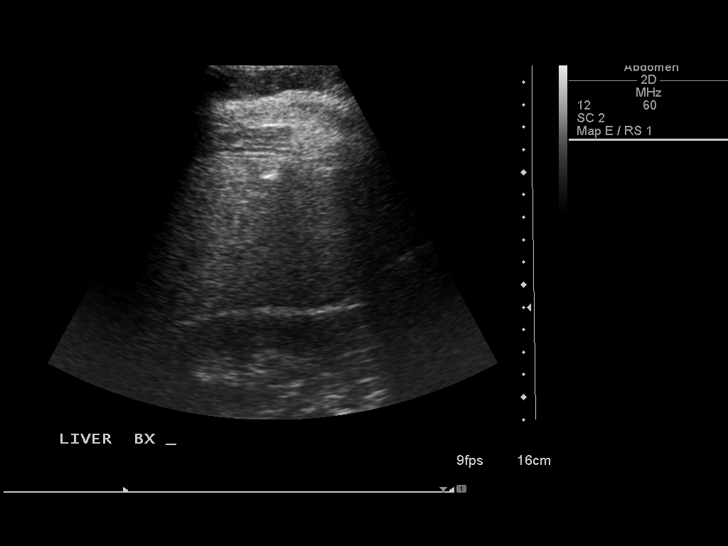
[im 10/15]
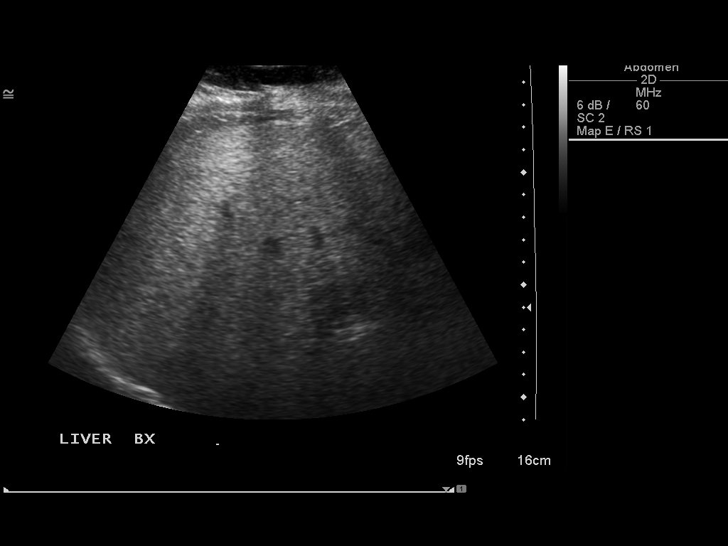
[im 11/15]
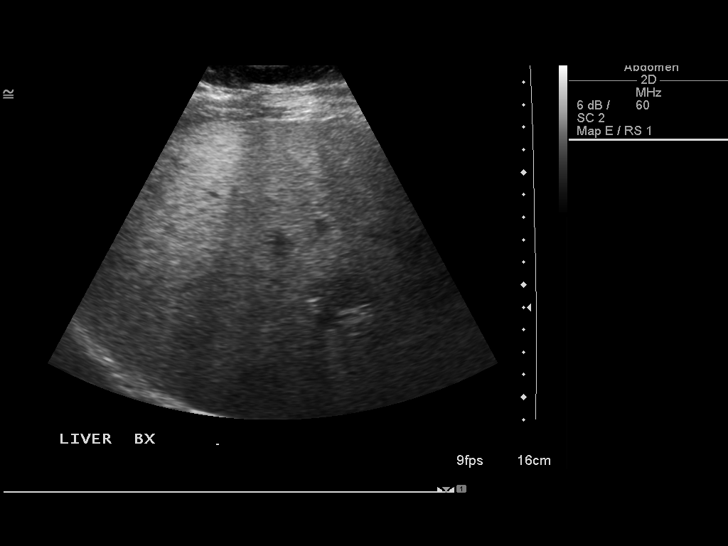
[im 13/15]
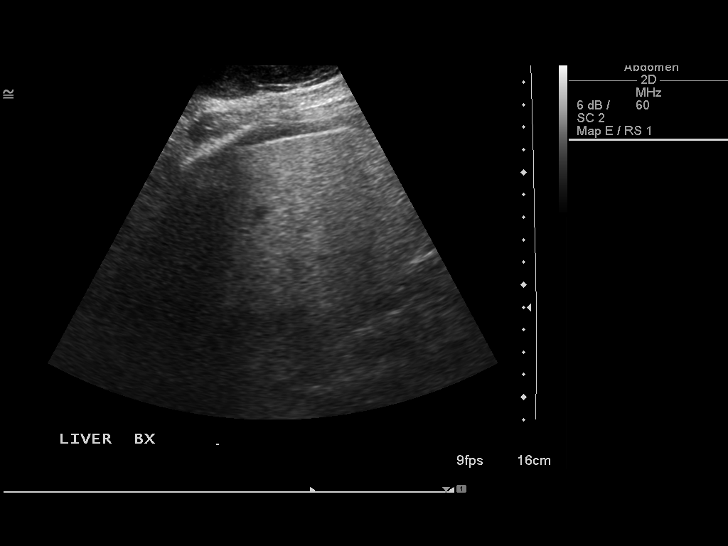
[im 14/15]
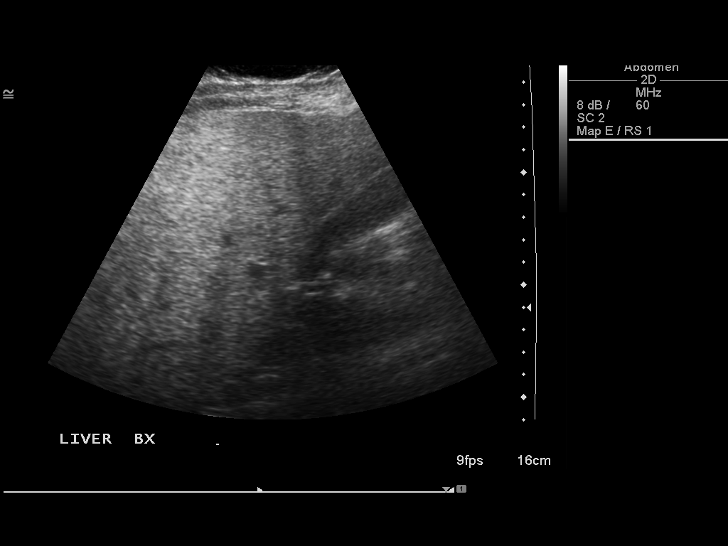
[im 15/15]
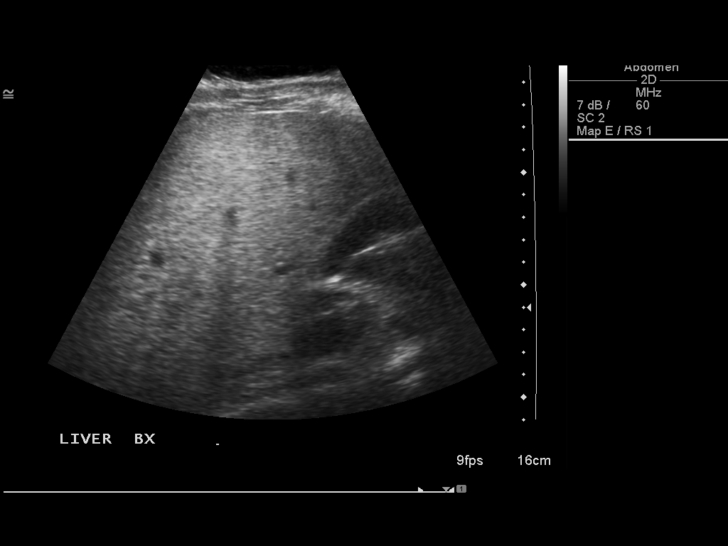

[13 of 15 positions shown; findings below may reference images not displayed]

IMPRESSION: 1. Technically successful ultrasound guided random liver core
biopsy with moderate sedation as described above.

Read by: Renae, Abanoub.-IDALMIS

## 2012-04-10 ENCOUNTER — Encounter (HOSPITAL_COMMUNITY): Payer: Self-pay | Admitting: *Deleted

## 2012-04-10 ENCOUNTER — Emergency Department (HOSPITAL_COMMUNITY)
Admission: EM | Admit: 2012-04-10 | Discharge: 2012-04-11 | Disposition: A | Discharge status: Home / Self Care | Payer: BC Managed Care – PPO | Attending: Emergency Medicine | Admitting: Emergency Medicine

## 2012-04-10 DIAGNOSIS — R109 Unspecified abdominal pain: Secondary | ICD-10-CM

## 2012-04-10 DIAGNOSIS — Z9089 Acquired absence of other organs: Secondary | ICD-10-CM | POA: Insufficient documentation

## 2012-04-10 DIAGNOSIS — Z87891 Personal history of nicotine dependence: Secondary | ICD-10-CM | POA: Insufficient documentation

## 2012-04-10 DIAGNOSIS — R1031 Right lower quadrant pain: Secondary | ICD-10-CM | POA: Insufficient documentation

## 2012-04-10 NOTE — ED Notes (Signed)
Pt c/o RLQ and R pelvic pain, seen by PCP and evaluated w/o definitive diagnosis. Pt states pain radiating from RLQ to R lower back. Pt states pain worse w/ activity.

## 2012-04-11 ENCOUNTER — Emergency Department (HOSPITAL_COMMUNITY): Payer: BC Managed Care – PPO

## 2012-04-11 ENCOUNTER — Telehealth: Payer: Self-pay | Admitting: Gastroenterology

## 2012-04-11 LAB — COMPREHENSIVE METABOLIC PANEL
ALT: 58 U/L — ABNORMAL HIGH (ref 0–35)
Alkaline Phosphatase: 58 U/L (ref 39–117)
BUN: 9 mg/dL (ref 6–23)
CO2: 24 mEq/L (ref 19–32)
Chloride: 99 mEq/L (ref 96–112)
GFR calc Af Amer: >90 mL/min (ref >90)
Glucose, Bld: 93 mg/dL (ref 70–99)
Potassium: 4.3 mEq/L (ref 3.5–5.1)
Sodium: 134 mEq/L — ABNORMAL LOW (ref 135–145)
Total Bilirubin: 0.9 mg/dL (ref 0.3–1.2)

## 2012-04-11 LAB — CBC WITH DIFFERENTIAL/PLATELET
Basophils Absolute: 0.1 10*3/uL (ref 0.0–0.1)
Eosinophils Absolute: 0.2 10*3/uL (ref 0.0–0.7)
Lymphocytes Relative: 35 % (ref 12–46)
Lymphs Abs: 3.1 10*3/uL (ref 0.7–4.0)
MCHC: 33.9 g/dL (ref 30.0–36.0)
Monocytes Relative: 8 % (ref 3–12)
Neutro Abs: 4.8 10*3/uL (ref 1.7–7.7)
Platelets: 297 10*3/uL (ref 150–400)
RDW: 12.8 % (ref 11.5–15.5)
WBC: 8.9 10*3/uL (ref 4.0–10.5)

## 2012-04-11 LAB — URINALYSIS, ROUTINE W REFLEX MICROSCOPIC
Bilirubin Urine: NEGATIVE
Glucose, UA: NEGATIVE mg/dL
Hgb urine dipstick: NEGATIVE
Ketones, ur: NEGATIVE mg/dL
Nitrite: NEGATIVE
Specific Gravity, Urine: 1.018 (ref 1.005–1.030)
pH: 7.5 (ref 5.0–8.0)

## 2012-04-11 LAB — LIPASE, BLOOD: Lipase: 30 U/L (ref 11–59)

## 2012-04-11 LAB — URINE MICROSCOPIC-ADD ON

## 2012-04-11 MED ORDER — HYDROCODONE-ACETAMINOPHEN 5-500 MG PO TABS: 1.0000 | ORAL_TABLET | Freq: Four times a day (QID) | ORAL | Status: AC | PRN

## 2012-04-11 MED ORDER — IOHEXOL 300 MG/ML  SOLN
100.0000 mL | Freq: Once | INTRAMUSCULAR | Status: AC | PRN
  Administered 2012-04-11: 100 mL via INTRAVENOUS

## 2012-04-11 MED ORDER — ONDANSETRON HCL 4 MG PO TABS: 4.0000 mg | ORAL_TABLET | Freq: Four times a day (QID) | ORAL | Status: AC

## 2012-04-11 NOTE — Telephone Encounter (Signed)
On call note. Pt having increasing RLQ pain radiating to back since Tuesday. Not improved with a laxatives for past day. No F/C, N/V, GI bleeding. We discussed trying more laxatives but she really does not feel constipated. Advised to have someone take her to ED for evaluation tonight for possible appendicitis, gyn process, etc. She states she will do so.

## 2012-04-11 NOTE — ED Notes (Signed)
Pt sts pain in her right lower abdomen/ pelvis that radiates to her right flank area began Tuesday. Patient sts pain is worse upon standing and with movement.

## 2012-04-11 NOTE — ED Provider Notes (Signed)
History     CSN: [id number]  Arrival date & time 04/10/12  2139   First MD Initiated Contact with Patient 04/11/12 0125      Chief Complaint  Patient presents with  . Pelvic Pain    (Consider location/radiation/quality/duration/timing/severity/associated sxs/prior treatment) HPI  Patient presents to the emergency department with complaints of right lower quadrant pain. She was seen by her primary care doctor and diagnosed with constipation 2 days ago. She states that the pain has been continuing to get worse over the past week. She denies having any dysuria or vaginal discharge. She denies having vomiting and nausea with fevers. He states that her pain feels like a dull ache sometimes with sharp pains. She says that she still has her appendix and her gallbladder. She denies having history of kidney stones or chronic abdominal pain problems. The patient is in no acute distress and her VSS.  Past Medical History  Diagnosis Date  . Elevated LFTs     Past Surgical History  Procedure Date  . Partial hysterectomy   . Cesarean section   . Abdominal hysterectomy 05/2010    partial  . Cholecystectomy 06/10/11     lap chole     Family History  Problem Relation Age of Onset  . Diabetes Maternal Grandmother     also Maternal Grandfather  . Heart disease Maternal Grandfather     and Father  . Kidney disease Paternal Grandmother     History  Substance Use Topics  . Smoking status: Former Smoker    Quit date: 06/01/2009  . Smokeless tobacco: Never Used  . Alcohol Use: No    OB History    Grav Para Term Preterm Abortions TAB SAB Ect Mult Living                  Review of Systems   HEENT: denies blurry vision or change in hearing PULMONARY: Denies difficulty breathing and SOB CARDIAC: denies chest pain or heart palpitations MUSCULOSKELETAL:  denies being unable to ambulate ABDOMEN AL: + RLQ abd pain GU: denies loss of bowel or urinary control NEURO: denies numbness  and tingling in extremities SKIN: no new rashes PSYCH: patient denies anxiety or depression. NECK: Pt denies having neck pain     Allergies  Vicodin  Home Medications   Current Outpatient Rx  Name Route Sig Dispense Refill  . IBUPROFEN 200 MG PO TABS Oral Take 800 mg by mouth every 6 (six) hours as needed.    Marland Kitchen HYDROCODONE-ACETAMINOPHEN 5-500 MG PO TABS Oral Take 1-2 tablets by mouth every 6 (six) hours as needed for pain. 15 tablet 0  . ONDANSETRON HCL 4 MG PO TABS Oral Take 1 tablet (4 mg total) by mouth every 6 (six) hours. 12 tablet 0    BP 141/81  Pulse 90  Temp 98.1 F (36.7 C) (Oral)  Resp 16  SpO2 98%  Physical Exam  Nursing note and vitals reviewed. Constitutional: She appears well-developed and well-nourished. No distress.  HENT:  Head: Normocephalic and atraumatic.  Eyes: Pupils are equal, round, and reactive to light.  Neck: Normal range of motion. Neck supple.  Cardiovascular: Normal rate and regular rhythm.   Pulmonary/Chest: Effort normal.  Abdominal: Soft. She exhibits no distension. There is tenderness (RLQ). There is no rebound and no guarding.  Neurological: She is alert.  Skin: Skin is warm and dry.    ED Course  Procedures (including critical care time)  Labs Reviewed  URINALYSIS, ROUTINE W REFLEX MICROSCOPIC -  Abnormal; Notable for the following:    APPearance TURBID (*)     All other components within normal limits  COMPREHENSIVE METABOLIC PANEL - Abnormal; Notable for the following:    Sodium 134 (*)     ALT 58 (*)     All other components within normal limits  POCT PREGNANCY, URINE  URINE MICROSCOPIC-ADD ON  LIPASE, BLOOD  CBC WITH DIFFERENTIAL  LAB REPORT - SCANNED   Ct Abdomen Pelvis W Contrast  04/11/2012  *RADIOLOGY REPORT*  Clinical Data: Right lower quadrant pain  CT ABDOMEN AND PELVIS WITH CONTRAST  Technique:  Multidetector CT imaging of the abdomen and pelvis was performed following the standard protocol during bolus  administration of intravenous contrast.  Contrast: OMNIPAQUE IOHEXOL 300 MG/ML  SOLN  Comparison: 07/01/2004  Findings: Limited images through the lung bases demonstrate no significant appreciable abnormality. The heart size is within normal limits. No pleural or pericardial effusion.  Hepatic steatosis with focal sparing adjacent to the gallbladder fossa.  Absent gallbladder.  No biliary ductal dilatation. Unremarkable spleen, pancreas, and adrenal glands.  Symmetric renal enhancement.  Tiny nonobstructing stone on the left.  No hydronephrosis or hydroureter unchanged pelvic calcifications in keeping with phlebolith.  Sigmoid colonic wall thickening is nonspecific given incomplete distension.  No bowel obstruction.  Normal appendix.  No free intraperitoneal air or fluid.  No lymphadenopathy.  Normal caliber vasculature.  Partially decompressed bladder.  Absent uterus.  No adnexal mass. Corpus luteal cyst on the right.  No acute osseous finding.  IMPRESSION: Hepatic steatosis.  Nonobstructing left renal stone.  No hydronephrosis or hydroureter.  Normal appendix.  Sigmoid colonic wall thickening is nonspecific given incomplete distension.   Original Report Authenticated By: Waneta Martins, M.D.      1. Abdominal pain       MDM  Patient's abdominal pain is not impressive. The pain has been persistent for one week without nausea or vomiting. CT scan is negative for bowel obstruction, appendicitis, bowel perforation. Patient has been referred to GI Dr. for further evaluation.  Given pain and nausea meds.  Pt has been advised of the symptoms that warrant their return to the ED. Patient has voiced understanding and has agreed to follow-up with the PCP or specialist.         Dorthula Matas, PA 04/12/12 [phone]

## 2012-04-12 NOTE — Telephone Encounter (Signed)
Pt has appt with her PCP Dr Ricki Miller today, she says she was told it was possibly muscular in nature.  She will call back after that visit if the PCP thinks she needs to see GI.

## 2012-04-12 NOTE — Telephone Encounter (Signed)
Patty,  Can you call, see how she is doing.  CT last night in ER without obvious explanation, CBC normal, lipase normal, CMEt normal except mild ALT elevation.  Offer ROV this week or next with me (if available time) or extender.  Thanks

## 2012-04-15 NOTE — ED Provider Notes (Signed)
Medical screening examination/treatment/procedure(s) were conducted as a shared visit with non-physician practitioner(s) and myself.  I personally evaluated the patient during the encounter.  No acute abdomen. We'll CT secondary to longevity of symptoms.  CT shows fatty liver but no other acute finding  Donnetta Hutching, MD 04/15/12 1749

## 2013-08-24 ENCOUNTER — Ambulatory Visit (INDEPENDENT_AMBULATORY_CARE_PROVIDER_SITE_OTHER): Payer: BC Managed Care – PPO | Admitting: Family Medicine

## 2013-08-24 ENCOUNTER — Ambulatory Visit: Payer: BC Managed Care – PPO

## 2013-08-24 VITALS — BP 140/82 | HR 77 | Temp 98.9°F | Resp 16 | Ht 65.5 in | Wt 200.0 lb

## 2013-08-24 DIAGNOSIS — R079 Chest pain, unspecified: Secondary | ICD-10-CM

## 2013-08-24 NOTE — Patient Instructions (Addendum)
Thank you for coming in today  It is very unlikely that your chest pain is from your heart or lungs - your CXR and EKG are normal Try ibuprofen or aleve for the next few days. You may take 3 ibuprofen 3x per day or 2 aleve 2x per day Check BP at work and keep log. Make sure that if you use your machine at home that you check that it is calibrated correctly. Followup with PCP in 1-2 weeks.       Chest Pain (Nonspecific) It is often hard to give a specific diagnosis for the cause of chest pain. There is always a chance that your pain could be related to something serious, such as a heart attack or a blood clot in the lungs. You need to follow up with your caregiver for further evaluation. CAUSES   Heartburn.  Pneumonia or bronchitis.  Anxiety or stress.  Inflammation around your heart (pericarditis) or lung (pleuritis or pleurisy).  A blood clot in the lung.  A collapsed lung (pneumothorax). It can develop suddenly on its own (spontaneous pneumothorax) or from injury (trauma) to the chest.  Shingles infection (herpes zoster virus). The chest wall is composed of bones, muscles, and cartilage. Any of these can be the source of the pain.  The bones can be bruised by injury.  The muscles or cartilage can be strained by coughing or overwork.  The cartilage can be affected by inflammation and become sore (costochondritis). DIAGNOSIS  Lab tests or other studies, such as X-rays, electrocardiography, stress testing, or cardiac imaging, may be needed to find the cause of your pain.  TREATMENT   Treatment depends on what may be causing your chest pain. Treatment may include:  Acid blockers for heartburn.  Anti-inflammatory medicine.  Pain medicine for inflammatory conditions.  Antibiotics if an infection is present.  You may be advised to change lifestyle habits. This includes stopping smoking and avoiding alcohol, caffeine, and chocolate.  You may be advised to keep your head  raised (elevated) when sleeping. This reduces the chance of acid going backward from your stomach into your esophagus.  Most of the time, nonspecific chest pain will improve within 2 to 3 days with rest and mild pain medicine. HOME CARE INSTRUCTIONS   If antibiotics were prescribed, take your antibiotics as directed. Finish them even if you start to feel better.  For the next few days, avoid physical activities that bring on chest pain. Continue physical activities as directed.  Do not smoke.  Avoid drinking alcohol.  Only take over-the-counter or prescription medicine for pain, discomfort, or fever as directed by your caregiver.  Follow your caregiver's suggestions for further testing if your chest pain does not go away.  Keep any follow-up appointments you made. If you do not go to an appointment, you could develop lasting (chronic) problems with pain. If there is any problem keeping an appointment, you must call to reschedule. SEEK MEDICAL CARE IF:   You think you are having problems from the medicine you are taking. Read your medicine instructions carefully.  Your chest pain does not go away, even after treatment.  You develop a rash with blisters on your chest. SEEK IMMEDIATE MEDICAL CARE IF:   You have increased chest pain or pain that spreads to your arm, neck, jaw, back, or abdomen.  You develop shortness of breath, an increasing cough, or you are coughing up blood.  You have severe back or abdominal pain, feel nauseous, or vomit.  You  develop severe weakness, fainting, or chills.  You have a fever. THIS IS AN EMERGENCY. Do not wait to see if the pain will go away. Get medical help at once. Call your local emergency services (911 in [place] Do not drive yourself to the hospital. MAKE SURE YOU:   Understand these instructions.  Will watch your condition.  Will get help right away if you are not doing well or get worse. Document Released: 05/14/2005 Document Revised:  10/27/2011 Document Reviewed: 03/09/2008 [facility] Patient Information 2014 [place].

## 2013-08-24 NOTE — Progress Notes (Signed)
   Subjective:    Patient ID: Amanda Stokes, female    DOB: 07-19-1976, 38 y.o.   MRN: [id number]  HPI Patient presents with not feeling well for last 2 days. Has bad headache that has been constant. Has chest pain on left side associated with pleuritic chest pain. BP also has been running high 164/108 for past 2 days. No cold or sinus symptoms. Has a lot of stress at work but thinks this is unrelated. Has history of occasional migraines and sinus headache but this is different. Pain is around whole head and is dull and constant. No vision change, dizziness, nausea, phono/photophobia. Feels not like her self. No abdominal complaints. No N/V/D. Poor appetite. No fever, no cough, congestion, rhinorrhea. No history of asthma or breathing problems. Does have perceived SOB and palpitations today with heart beating hard. Was able to go to work today but had to leave a little early.  No history of heart problems, HTN, HLD, or DM. Does smoke occasionally, about 2 per day but some days not at all. Father had to have open heart surgery in mid-to-late 66s.    Review of Systems  All other systems reviewed and are negative.       Objective:   Physical Exam  Constitutional: She is oriented to person, place, and time. She appears well-developed and well-nourished. No distress.  HENT:  Head: Normocephalic and atraumatic.  Mouth/Throat: Oropharynx is clear and moist.  Eyes: Pupils are equal, round, and reactive to light. No scleral icterus.  Neck: Normal range of motion. Neck supple.  Cardiovascular: Normal rate, regular rhythm, normal heart sounds and intact distal pulses.   No murmur heard. Pulmonary/Chest: Effort normal and breath sounds normal. No respiratory distress. She has no wheezes. She has no rales. She exhibits tenderness.  Abdominal: Soft. She exhibits no distension. There is no tenderness.  Neurological: She is alert and oriented to person, place, and time.  Skin: Skin is warm and dry. She is  not diaphoretic.  Psychiatric: She has a normal mood and affect. Her behavior is normal.      Assessment & Plan:  #1. Chest pain - low risk for cardiovascular etiology - given pleuritic nature, will obtain CXR - NEGATIVE - EKG - NSR, no abnormalities - Suspect MSK - trial NSAIDS - Unsure of etiology of patient's outside high BP readings. BP here high normal. Recheck 128/92. - Check BP at work and keep log. Make sure BP calibrated appropriately if using home machine. - F/u PCP in 1-2 weeks.

## 2013-11-15 ENCOUNTER — Ambulatory Visit (INDEPENDENT_AMBULATORY_CARE_PROVIDER_SITE_OTHER): Payer: BC Managed Care – PPO | Admitting: Endocrinology

## 2013-11-15 ENCOUNTER — Encounter: Payer: Self-pay | Admitting: Endocrinology

## 2013-11-15 VITALS — BP 132/88 | HR 99 | Temp 97.9°F | Resp 16 | Ht 66.5 in | Wt 206.8 lb

## 2013-11-15 DIAGNOSIS — R635 Abnormal weight gain: Secondary | ICD-10-CM

## 2013-11-15 DIAGNOSIS — L658 Other specified nonscarring hair loss: Secondary | ICD-10-CM

## 2013-11-15 DIAGNOSIS — R232 Flushing: Secondary | ICD-10-CM

## 2013-11-15 DIAGNOSIS — N951 Menopausal and female climacteric states: Secondary | ICD-10-CM

## 2013-11-15 DIAGNOSIS — Z131 Encounter for screening for diabetes mellitus: Secondary | ICD-10-CM

## 2013-11-15 DIAGNOSIS — R5381 Other malaise: Secondary | ICD-10-CM

## 2013-11-15 DIAGNOSIS — R5383 Other fatigue: Secondary | ICD-10-CM

## 2013-11-15 LAB — GLUCOSE, RANDOM: Glucose, Bld: 98 mg/dL (ref 70–99)

## 2013-11-15 NOTE — Progress Notes (Addendum)
Patient ID: Amanda Stokes, female   DOB: Feb 05, 1976, 38 y.o.   MRN: [id number]   Chief complaint: Hair loss  History of Present Illness:   She has been noticing gradual hair loss for the last 3 years or so. This has been mostly on her crown area and front of the scalp. Has not had any loss of hair from the eyebrows. She recently went to the dermatologist for evaluation. She has small amount of hair on her chin also which is recent but no excessive body hair. She usually has had to shave her legs regularly. No excessive acne She also has had weight gain of about 20 pounds over the last 3 years. She thinks she is usually watching her diet with generally eating one meal a day. Menstrual cycles not and because of hysterectomy  She was evaluated with the following labs: Free T4 and TSH normal, FSH 5.6, LH 4.1 Total testosterone 48, bioavailable testosterone 20.6 (normal <8.5)    Past Medical History  Diagnosis Date  . Elevated LFTs     Past Surgical History  Procedure Laterality Date  . Partial hysterectomy    . Cesarean section    . Abdominal hysterectomy  05/2010    partial  . Cholecystectomy  06/10/11     lap chole     Family History  Problem Relation Age of Onset  . Diabetes Maternal Grandmother     also Maternal Grandfather  . Heart disease Maternal Grandfather     and Father  . Diabetes Maternal Grandfather   . Hypertension Maternal Grandfather   . Stroke Maternal Grandfather   . Kidney disease Paternal Grandmother   . Heart disease Father     Social History:  reports that she has been smoking.  She has never used smokeless tobacco. She reports that she does not drink alcohol or use illicit drugs.  Allergies:  Allergies  Allergen Reactions  . Vicodin [Hydrocodone-Acetaminophen] Itching      Medication List       This list is accurate as of: 11/15/13  9:33 AM.  Always use your most recent med list.               ibuprofen 200 MG tablet  Commonly known as:   ADVIL,MOTRIN  Take 800 mg by mouth every 6 (six) hours as needed.           REVIEW OF SYSTEMS:           No unusual headaches.       Skin: No rash or infections no excessive bruising     Thyroid:  No cold or heat intolerance. However she tends to feel tired and wakes up tired in the morning     His blood pressure has been periodically high with stress, she thinks this was recently better     No swelling of feet, mild puffiness in the evenings     No shortness of breath     Bowel habits:  No change.       No frequency of urination or excessive nocturia      Occasional knee joint  Pains. Has been told to have osteoarthritis      Hot flashes, night sweats have been present for 1 year, had mild symptoms after hysterectomy also     PHYSICAL EXAM:  BP 132/88  Pulse 99  Temp(Src) 97.9 F (36.6 C)  Resp 16  Ht 5' 6.5" (1.689 m)  Wt 206 lb 12.8 oz (93.804 kg)  BMI 32.88 kg/m2  SpO2 96%  GENERAL: Mild generalized obesity present. No moon facies or plethora  No pallor, clubbing, lymphadenopathy or edema.   Skin:  no rash or abnormal pigmentation. She has thinning of the frontal and temporal scalp hair towards the vertex. No significant hirsutism or excessive body hair. No acne. No thinning of the skin, no purplish striae  EYES:  Externally normal.  Fundii:  normal discs and vessels.  ENT: Oral mucosa and tongue normal.  THYROID:  Not palpable.   HEART:  Normal  S1 and S2; no murmur or click.  CHEST:  Normal shape.  Lungs: Vescicular breath sounds heard equally.  No crepitations/ wheeze.  ABDOMEN:  No distention.  Liver and spleen not palpable.  No other mass or tenderness.  NEUROLOGICAL: .Reflexes are bilaterally at biceps.  JOINTS:  Normal.  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 Most likely with family history of diabetes she has polycystic ovarian syndrome. Also has had recent weight gain which may contribute to increased  insulin resistance. However does not have associated acne or hirsutism. Does not have any features of Cushing's disease. Not clear why her free testosterone level is markedly increased despite normal total testosterone. Also prior to this to me did not have any menstrual disturbances  Fatigue of unclear etiology  Increased blood pressure today     PLAN:   Will recheck free testosterone today as well as DHEA-S. level Also because of her hot flashes and night sweats will check her estradiol level To check glucose level because of family history and recent history of weight gain   Thao Vanover 11/15/2013, 9:33 AM    Addendum: All labs normal except free testosterone which is 12.7. Will order ovarian ultrasound before starting metformin, discussed with patient

## 2013-11-16 DIAGNOSIS — L658 Other specified nonscarring hair loss: Secondary | ICD-10-CM | POA: Insufficient documentation

## 2013-11-22 ENCOUNTER — Other Ambulatory Visit: Payer: Self-pay | Admitting: Endocrinology

## 2013-11-22 ENCOUNTER — Telehealth: Payer: Self-pay | Admitting: *Deleted

## 2013-11-22 DIAGNOSIS — E288 Other ovarian dysfunction: Secondary | ICD-10-CM

## 2013-11-22 LAB — TESTOSTERONE, FREE, TOTAL, SHBG
TESTOSTERONE FREE: 12.7 pg/mL — AB (ref 0.0–4.2)
Testosterone, total: 29.9 ng/dL (ref 10.0–55.0)

## 2013-11-22 LAB — ESTRADIOL, FREE
ESTRADIOL: 59 pg/mL
Free Estradiol, Percent: 2.2 %
Free Estradiol, Serum: 1.3 pg/mL

## 2013-11-22 LAB — DHEA-SULFATE: DHEA SO4: 182.2 ug/dL (ref 57.3–279.2)

## 2013-11-22 NOTE — Telephone Encounter (Signed)
Patient is requesting lab results.

## 2013-11-30 ENCOUNTER — Telehealth: Payer: Self-pay | Admitting: *Deleted

## 2013-11-30 ENCOUNTER — Other Ambulatory Visit: Payer: Self-pay | Admitting: Endocrinology

## 2013-11-30 DIAGNOSIS — E288 Other ovarian dysfunction: Secondary | ICD-10-CM

## 2013-11-30 NOTE — Telephone Encounter (Signed)
Stanton Kidney said she sent the referral on the 8th. She was going to call and find out what was going on.

## 2013-11-30 NOTE — Telephone Encounter (Signed)
Patient said you were going to send her to have an ultrasound done of her Ovaries, but that's been over a week ago and she has not heard from anyone.  Please advise

## 2013-11-30 NOTE — Telephone Encounter (Signed)
The radiologist office was requesting a vaginal ultrasound and need to find out why instead of abdominal ultrasound. Please check with the referral office

## 2013-12-01 ENCOUNTER — Other Ambulatory Visit: Payer: Self-pay | Admitting: Endocrinology

## 2013-12-01 DIAGNOSIS — L658 Other specified nonscarring hair loss: Secondary | ICD-10-CM

## 2013-12-06 ENCOUNTER — Other Ambulatory Visit: Payer: Self-pay | Admitting: Endocrinology

## 2013-12-06 ENCOUNTER — Telehealth: Payer: Self-pay | Admitting: *Deleted

## 2013-12-06 DIAGNOSIS — E288 Other ovarian dysfunction: Secondary | ICD-10-CM

## 2013-12-06 NOTE — Telephone Encounter (Signed)
Completed, there is also an order for transvaginal ultrasound

## 2013-12-06 NOTE — Telephone Encounter (Signed)
Sherry from [facility] called, she said she needs to orders changed to an ultrasound pelvis complete instead of an ultrasound pelvis limited. She's waiting to schedule the patient until this is changed.

## 2013-12-15 ENCOUNTER — Ambulatory Visit
Admission: RE | Admit: 2013-12-15 | Discharge: 2013-12-15 | Disposition: A | Discharge status: Home / Self Care | Payer: BC Managed Care – PPO | Source: Ambulatory Visit | Attending: Endocrinology | Admitting: Endocrinology

## 2013-12-15 ENCOUNTER — Encounter (INDEPENDENT_AMBULATORY_CARE_PROVIDER_SITE_OTHER): Payer: Self-pay

## 2013-12-15 DIAGNOSIS — E288 Other ovarian dysfunction: Secondary | ICD-10-CM

## 2013-12-15 DIAGNOSIS — L658 Other specified nonscarring hair loss: Secondary | ICD-10-CM

## 2013-12-16 ENCOUNTER — Telehealth: Payer: Self-pay | Admitting: *Deleted

## 2013-12-16 NOTE — Telephone Encounter (Signed)
Message copied by Harl Bowie on Fri Dec 16, 2013 11:27 AM ------      Message from: Elayne Snare      Created: Fri Dec 16, 2013 10:50 AM       Please let patient know that the ultrasound showed cysts but will need to discuss this with her gynecologist, need name of gynecologist ------

## 2013-12-16 NOTE — Progress Notes (Signed)
Quick Note:  Please let patient know that the ultrasound showed cysts but will need to discuss this with her gynecologist, need name of gynecologist ______

## 2013-12-16 NOTE — Telephone Encounter (Signed)
Spoke with the pt and informed her of recent pelvic u/s results and note.  Pt understood and agreed.    Asked the pt if she had an appt scheduled with the her GYN, and what is her GYN's name.  Pt stated that she does not have an appt scheduled, and she sees Dr. Molli Posey with [facility].//AB/CMA

## 2013-12-19 ENCOUNTER — Telehealth: Payer: Self-pay | Admitting: Endocrinology

## 2013-12-19 NOTE — Telephone Encounter (Signed)
Please send notes to Dr. Matthew Saras at [facility]

## 2014-02-14 ENCOUNTER — Telehealth: Payer: Self-pay

## 2014-02-14 NOTE — Telephone Encounter (Signed)
This is a workers comp case- Dola Argyle is looking into this.

## 2014-02-14 NOTE — Telephone Encounter (Signed)
[facility] called concerning to see if  Dr. Brigitte Pulse has received [facility] MRI results. Please advise at [phone]

## 2014-02-14 NOTE — Telephone Encounter (Signed)
PT STATES SHE HAD AN MRI DONE AND WAS TOLD IT WASN'T BACK YET, SHE CALLED AND THEY SAID IT WAS, WOULD LIKE TO KNOW THE RESULTS PLEASE CALL [phone]

## 2014-02-14 NOTE — Telephone Encounter (Signed)
This is a w/c. Pt has already received results. [employer] Rump is pt's employer. Pt's will talk to her.

## 2014-02-20 ENCOUNTER — Other Ambulatory Visit: Payer: BC Managed Care – PPO

## 2014-02-20 ENCOUNTER — Other Ambulatory Visit: Payer: Self-pay | Admitting: *Deleted

## 2014-02-23 ENCOUNTER — Encounter: Payer: Self-pay | Admitting: Endocrinology

## 2014-02-23 ENCOUNTER — Ambulatory Visit (INDEPENDENT_AMBULATORY_CARE_PROVIDER_SITE_OTHER): Payer: BC Managed Care – PPO | Admitting: Endocrinology

## 2014-02-23 VITALS — BP 134/90 | HR 95 | Temp 97.7°F | Resp 14 | Ht 66.5 in | Wt 205.4 lb

## 2014-02-23 DIAGNOSIS — E282 Polycystic ovarian syndrome: Secondary | ICD-10-CM

## 2014-02-23 DIAGNOSIS — I10 Essential (primary) hypertension: Secondary | ICD-10-CM

## 2014-02-23 MED ORDER — METFORMIN HCL ER 500 MG PO TB24: 1500.0000 mg | ORAL_TABLET | Freq: Every day | ORAL | Status: DC

## 2014-02-23 NOTE — Patient Instructions (Signed)
Metformin at dinner, start 1 daily

## 2014-02-23 NOTE — Progress Notes (Signed)
Patient ID: Amanda Stokes, female   DOB: 1976-06-10, 38 y.o.   MRN: [id number]   Chief complaint: Hair loss  History of Present Illness:   She has had gradual hair loss for the last 3 years or so. This has been mostly on her crown area and front of the scalp. Has not had any loss of hair from the eyebrows. She has small amount of hair on her chin also which is recent but no excessive body hair. She usually has had to shave her legs regularly. No significant acne  She also has had weight gain of about 20 pounds over the last 3 years.  She thinks she is usually watching her diet with limiting calories Exercise: On and off, Cardio 1-2/7 days a week  She was evaluated with the following labs: free testosterone high at 12.7 and previous labs: Free T4 and TSH normal, FSH 5.6, LH 4.1 Total testosterone 48, bioavailable testosterone 20.6 (normal <8.5)  Since ovary on ultrasound showed a relatively large cyst she was referred to her gynecologist who felt that she had PCOS. She is now here for further management   Past Medical History  Diagnosis Date  . Elevated LFTs     Past Surgical History  Procedure Laterality Date  . Partial hysterectomy    . Cesarean section    . Abdominal hysterectomy  05/2010    partial  . Cholecystectomy  06/10/11     lap chole     Family History  Problem Relation Age of Onset  . Diabetes Maternal Grandmother     also Maternal Grandfather  . Heart disease Maternal Grandfather     and Father  . Diabetes Maternal Grandfather   . Hypertension Maternal Grandfather   . Stroke Maternal Grandfather   . Kidney disease Paternal Grandmother   . Heart disease Father   . Thyroid disease Neg Hx     Social History:  reports that she has been smoking.  She has never used smokeless tobacco. She reports that she does not drink alcohol or use illicit drugs.  Allergies:  Allergies  Allergen Reactions  . Vicodin [Hydrocodone-Acetaminophen] Itching      Medication  List       This list is accurate as of: 02/23/14 11:59 PM.  Always use your most recent med list.               hydrochlorothiazide 25 MG tablet  Commonly known as:  HYDRODIURIL     ibuprofen 200 MG tablet  Commonly known as:  ADVIL,MOTRIN  Take 800 mg by mouth every 6 (six) hours as needed.     metFORMIN 500 MG 24 hr tablet  Commonly known as:  GLUCOPHAGE-XR  Take 3 tablets (1,500 mg total) by mouth daily with supper.         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 She has polycystic ovarian syndrome and has a significantly high free testosterone  level. Also has had  weight gain which may contribute to increased insulin resistance. However does not have associated acne or hirsutism.   Normal fasting glucose  Increased blood pressure treated by PCP with HCTZ  Obesity   PLAN:   Discussed management of PCOS. with  metformin and doses titration of the metformin For convenience will use metformin ER and tried to get her up to at least 1500 mg The dose will be adjusted based on her followup free testosterone levels Encouraged her to increase her exercise level for weight loss Will also check her lipids on the next visit   Amanda Stokes 02/24/2014, 8:22 AM

## 2014-03-06 ENCOUNTER — Telehealth: Payer: Self-pay | Admitting: Endocrinology

## 2014-03-06 NOTE — Telephone Encounter (Signed)
Patient would like to speak with Dr. Dwyane Dee assistant  Patient was prescribed metformin and she had to stop; made her ill   Please advise   Thank you

## 2014-03-06 NOTE — Telephone Encounter (Signed)
Patient said since she started taking Metformin she's been nauseous and vomiting with horrible diarrhea, this is why she quit taking it.

## 2014-03-07 NOTE — Telephone Encounter (Signed)
Will not prescribe any medication at this time to replace metformin Will see her in followup in 9/15 instead of October with labs. She needs to continue efforts to lose weight

## 2014-03-07 NOTE — Telephone Encounter (Signed)
Noted patient is aware 

## 2014-05-22 ENCOUNTER — Other Ambulatory Visit (INDEPENDENT_AMBULATORY_CARE_PROVIDER_SITE_OTHER): Payer: BC Managed Care – PPO

## 2014-05-22 DIAGNOSIS — I10 Essential (primary) hypertension: Secondary | ICD-10-CM

## 2014-05-22 DIAGNOSIS — E282 Polycystic ovarian syndrome: Secondary | ICD-10-CM

## 2014-05-22 LAB — LIPID PANEL
CHOL/HDL RATIO: 3
Cholesterol: 200 mg/dL (ref 0–200)
HDL: 61 mg/dL (ref >39.00)
LDL CALC: 103 mg/dL — AB (ref 0–99)
NONHDL: 139
Triglycerides: 178 mg/dL — ABNORMAL HIGH (ref 0.0–149.0)
VLDL: 35.6 mg/dL (ref 0.0–40.0)

## 2014-05-22 LAB — BASIC METABOLIC PANEL
BUN: 15 mg/dL (ref 6–23)
CALCIUM: 9.3 mg/dL (ref 8.4–10.5)
CO2: 26 meq/L (ref 19–32)
CREATININE: 0.8 mg/dL (ref 0.4–1.2)
Chloride: 100 mEq/L (ref 96–112)
GFR: 82.25 mL/min (ref >60.00)
GLUCOSE: 86 mg/dL (ref 70–99)
Potassium: 3.7 mEq/L (ref 3.5–5.1)
Sodium: 135 mEq/L (ref 135–145)

## 2014-05-25 LAB — TESTOSTERONE, FREE, TOTAL, SHBG
Testosterone, Free: <0.2 pg/mL (ref 0.0–4.2)
Testosterone, total: 19.2 ng/dL (ref 10.0–55.0)

## 2014-05-26 ENCOUNTER — Encounter: Payer: Self-pay | Admitting: Endocrinology

## 2014-05-26 ENCOUNTER — Ambulatory Visit (INDEPENDENT_AMBULATORY_CARE_PROVIDER_SITE_OTHER): Payer: BC Managed Care – PPO | Admitting: Endocrinology

## 2014-05-26 VITALS — BP 128/82 | HR 82 | Temp 97.7°F | Resp 16 | Ht 66.5 in | Wt 203.0 lb

## 2014-05-26 DIAGNOSIS — E282 Polycystic ovarian syndrome: Secondary | ICD-10-CM

## 2014-05-26 DIAGNOSIS — I10 Essential (primary) hypertension: Secondary | ICD-10-CM

## 2014-05-26 DIAGNOSIS — L658 Other specified nonscarring hair loss: Secondary | ICD-10-CM

## 2014-05-26 MED ORDER — SPIRONOLACTONE 50 MG PO TABS: 50.0000 mg | ORAL_TABLET | Freq: Every day | ORAL | Status: DC

## 2014-05-26 NOTE — Patient Instructions (Signed)
Change HCTZ to Spironlactone in am daily

## 2014-05-26 NOTE — Progress Notes (Signed)
Patient ID: Amanda Stokes, female   DOB: [birth year], 38 y.o.   MRN: [id number]   Chief complaint: Hair loss  History of Present Illness:   She has had gradual hair loss for the last 3 years or so.  This has been mostly on her crown area and front of the scalp.  She has  recently noted. Greater amount of hair on her chin and face but no excessive body hair.  She usually has had to shave her legs regularly. No significant acne  Because of her high free testosterone level of 12.7 she was given a trial of metformin but she had significant diarrhea and nausea with this and stopped it The ovary on ultrasound showed a relatively large cyst she was referred to her gynecologist who felt that she had PCOS.  Her testosterone appears to be lower now even though she has not lost significant amount of weight  She previously had weight gain of about 20 pounds over the last 3 years.  She has not tried to change her diet recently Exercise:  Cardio 1-2/7 days a week, limited by back pain    Wt Readings from Last 3 Encounters:  05/26/14 203 lb (92.08 kg)  02/23/14 205 lb 6.4 oz (93.169 kg)  11/15/13 206 lb 12.8 oz (93.804 kg)     Past Medical History  Diagnosis Date  . Elevated LFTs     Past Surgical History  Procedure Laterality Date  . Partial hysterectomy    . Cesarean section    . Abdominal hysterectomy  05/2010    partial  . Cholecystectomy  06/10/11     lap chole     Family History  Problem Relation Age of Onset  . Diabetes Maternal Grandmother     also Maternal Grandfather  . Heart disease Maternal Grandfather     and Father  . Diabetes Maternal Grandfather   . Hypertension Maternal Grandfather   . Stroke Maternal Grandfather   . Kidney disease Paternal Grandmother   . Heart disease Father   . Thyroid disease Neg Hx     Social History:  reports that she has been smoking.  She has never used smokeless tobacco. She reports that she does not drink alcohol or use illicit  drugs.  Allergies:  Allergies  Allergen Reactions  . Vicodin [Hydrocodone-Acetaminophen] Itching      Medication List       This list is accurate as of: 05/26/14  1:51 PM.  Always use your most recent med list.               hydrochlorothiazide 25 MG tablet  Commonly known as:  HYDRODIURIL     HYDROcodone-acetaminophen 5-325 MG per tablet  Commonly known as:  NORCO/VICODIN  Take 1-2 tablets by mouth every 6 (six) hours as needed for moderate pain.     ibuprofen 200 MG tablet  Commonly known as:  ADVIL,MOTRIN  Take 800 mg by mouth every 6 (six) hours as needed.     metFORMIN 500 MG 24 hr tablet  Commonly known as:  GLUCOPHAGE-XR  Take 3 tablets (1,500 mg total) by mouth daily with supper.     ZIPSOR 25 MG Caps  Generic drug:  Diclofenac Potassium  25 mg every 6 (six) hours.         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 She has polycystic ovarian syndrome and has a previously high free testosterone level. Also has had hirsutism recently. However not clear why she has a normal free testosterone level now without any specific intervention or significant weight loss  Normal fasting glucose  Increased blood pressure treated by PCP with HCTZ  Obesity  Minimal increase in lipids   PLAN:   Trial of Aldactone instead of HCTZ for hirsutism and hair  loss Recheck free testosterone on the next visit She may be a candidate for BCP if testosterone level is higher Encouraged her to resume exercise and watch diet as much as possible  Lidiya Reise 05/26/2014, 1:51 PM

## 2014-06-09 ENCOUNTER — Other Ambulatory Visit: Payer: BC Managed Care – PPO

## 2014-06-12 ENCOUNTER — Other Ambulatory Visit (INDEPENDENT_AMBULATORY_CARE_PROVIDER_SITE_OTHER): Payer: BC Managed Care – PPO

## 2014-06-12 DIAGNOSIS — I10 Essential (primary) hypertension: Secondary | ICD-10-CM

## 2014-06-12 LAB — BASIC METABOLIC PANEL
BUN: 9 mg/dL (ref 6–23)
CALCIUM: 9.2 mg/dL (ref 8.4–10.5)
CO2: 30 mEq/L (ref 19–32)
Chloride: 103 mEq/L (ref 96–112)
Creatinine, Ser: 0.9 mg/dL (ref 0.4–1.2)
GFR: 80 mL/min (ref >60.00)
Glucose, Bld: 98 mg/dL (ref 70–99)
Potassium: 3.8 mEq/L (ref 3.5–5.1)
SODIUM: 137 meq/L (ref 135–145)

## 2014-06-19 NOTE — Progress Notes (Signed)
History and physical examinations reviewed with Dr. Casimer Lanius. EKG and CXR reviewed. Agree with assessment and plan.

## 2014-08-25 ENCOUNTER — Other Ambulatory Visit (INDEPENDENT_AMBULATORY_CARE_PROVIDER_SITE_OTHER): Payer: 59

## 2014-08-25 ENCOUNTER — Other Ambulatory Visit: Payer: BC Managed Care – PPO

## 2014-08-25 ENCOUNTER — Other Ambulatory Visit: Payer: Self-pay | Admitting: *Deleted

## 2014-08-25 DIAGNOSIS — E282 Polycystic ovarian syndrome: Secondary | ICD-10-CM

## 2014-08-25 LAB — BASIC METABOLIC PANEL
BUN: 8 mg/dL (ref 6–23)
CALCIUM: 9.2 mg/dL (ref 8.4–10.5)
CO2: 23 mEq/L (ref 19–32)
Chloride: 107 mEq/L (ref 96–112)
Creatinine, Ser: 0.7 mg/dL (ref 0.4–1.2)
GFR: 101.65 mL/min (ref >60.00)
GLUCOSE: 94 mg/dL (ref 70–99)
POTASSIUM: 3.9 meq/L (ref 3.5–5.1)
SODIUM: 138 meq/L (ref 135–145)

## 2014-08-28 LAB — TESTOSTERONE, FREE, TOTAL, SHBG
TESTOSTERONE, TOTAL: 36.6 ng/dL (ref 10.0–55.0)
Testosterone, Free: 0.9 pg/mL (ref 0.0–4.2)

## 2014-08-31 ENCOUNTER — Ambulatory Visit: Payer: BC Managed Care – PPO | Admitting: Endocrinology

## 2014-09-11 ENCOUNTER — Telehealth: Payer: Self-pay | Admitting: Endocrinology

## 2014-09-11 NOTE — Telephone Encounter (Signed)
-----   Message from Elayne Snare, MD sent at 09/04/2014  8:49 AM EST ----- Regarding: Appointment needed She got her lab appointment but not an office visit to review the labs and discuss medications, please schedule

## 2014-09-11 NOTE — Telephone Encounter (Signed)
LM for pt to call back to reschedule FU appt

## 2014-09-27 ENCOUNTER — Other Ambulatory Visit: Payer: Self-pay | Admitting: *Deleted

## 2014-09-27 MED ORDER — SPIRONOLACTONE 50 MG PO TABS: 50.0000 mg | ORAL_TABLET | Freq: Every day | ORAL | Status: DC

## 2014-11-24 DIAGNOSIS — Z0271 Encounter for disability determination: Secondary | ICD-10-CM

## 2015-02-04 ENCOUNTER — Emergency Department (HOSPITAL_COMMUNITY)
Admission: EM | Admit: 2015-02-04 | Discharge: 2015-02-04 | Disposition: A | Discharge status: Home / Self Care | Payer: 59 | Source: Home / Self Care | Attending: Family Medicine | Admitting: Family Medicine

## 2015-02-04 ENCOUNTER — Encounter (HOSPITAL_COMMUNITY): Payer: Self-pay | Admitting: *Deleted

## 2015-02-04 DIAGNOSIS — H6983 Other specified disorders of Eustachian tube, bilateral: Secondary | ICD-10-CM | POA: Diagnosis not present

## 2015-02-04 MED ORDER — IPRATROPIUM BROMIDE 0.06 % NA SOLN: 2.0000 | Freq: Four times a day (QID) | NASAL | Status: DC

## 2015-02-04 MED ORDER — PROMETHAZINE HCL 25 MG PO TABS: 25.0000 mg | ORAL_TABLET | Freq: Four times a day (QID) | ORAL | Status: DC | PRN

## 2015-02-04 NOTE — ED Provider Notes (Signed)
CSN: [id number]     Arrival date & time 02/04/15  1837 History   First MD Initiated Contact with Patient 02/04/15 1850     Chief Complaint  Patient presents with  . Dizziness   (Consider location/radiation/quality/duration/timing/severity/associated sxs/prior Treatment) Patient is a 39 y.o. female presenting with dizziness. The history is provided by the patient.  Dizziness Quality:  Lightheadedness Severity:  Mild Onset quality:  Gradual Duration:  1 week Chronicity:  New Context comment:  Feels like a swimmers ear from [employer]. Worsened by:  Turning head Associated symptoms: no headaches     Past Medical History  Diagnosis Date  . Elevated LFTs    Past Surgical History  Procedure Laterality Date  . Partial hysterectomy    . Cesarean section    . Abdominal hysterectomy  05/2010    partial  . Cholecystectomy  06/10/11     lap chole    Family History  Problem Relation Age of Onset  . Diabetes Maternal Grandmother     also Maternal Grandfather  . Heart disease Maternal Grandfather     and Father  . Diabetes Maternal Grandfather   . Hypertension Maternal Grandfather   . Stroke Maternal Grandfather   . Kidney disease Paternal Grandmother   . Heart disease Father   . Thyroid disease Neg Hx    History  Substance Use Topics  . Smoking status: Current Every Day Smoker -- 1.00 packs/day    Last Attempt to Quit: 06/01/2009  . Smokeless tobacco: Never Used  . Alcohol Use: No   OB History    No data available     Review of Systems  Constitutional: Negative.   HENT: Positive for congestion and rhinorrhea. Negative for ear discharge and ear pain.   Respiratory: Negative.   Neurological: Positive for dizziness. Negative for light-headedness and headaches.    Allergies  Vicodin  Home Medications   Prior to Admission medications   Medication Sig Start Date End Date Taking? Authorizing Provider  HYDROcodone-acetaminophen (NORCO/VICODIN) 5-325 MG per  tablet Take 1-2 tablets by mouth every 6 (six) hours as needed for moderate pain.    Historical Provider, MD  ibuprofen (ADVIL,MOTRIN) 200 MG tablet Take 800 mg by mouth every 6 (six) hours as needed.    Historical Provider, MD  ipratropium (ATROVENT) 0.06 % nasal spray Place 2 sprays into both nostrils 4 (four) times daily. 02/04/15   Billy Fischer, MD  metFORMIN (GLUCOPHAGE-XR) 500 MG 24 hr tablet Take 3 tablets (1,500 mg total) by mouth daily with supper. 02/23/14   Elayne Snare, MD  promethazine (PHENERGAN) 25 MG tablet Take 1 tablet (25 mg total) by mouth every 6 (six) hours as needed for nausea or vomiting. 02/04/15   Billy Fischer, MD  spironolactone (ALDACTONE) 50 MG tablet Take 1 tablet (50 mg total) by mouth daily. 09/27/14   Elayne Snare, MD  ZIPSOR 25 MG CAPS 25 mg every 6 (six) hours.  04/04/14   Historical Provider, MD   BP 148/74 mmHg  Pulse 86  Temp(Src) 98.6 F (37 C) (Oral)  Resp 16  SpO2 100% Physical Exam  Constitutional: She is oriented to person, place, and time. She appears well-developed and well-nourished. No distress.  HENT:  Head: Normocephalic.  Right Ear: Hearing, tympanic membrane, external ear and ear canal normal.  Left Ear: Hearing, tympanic membrane, external ear and ear canal normal.  Nose: Nose normal.  Mouth/Throat: Oropharynx is clear and moist.  Eyes: Conjunctivae and EOM are normal. Pupils  are equal, round, and reactive to light.  Neck: Normal range of motion. Neck supple.  Cardiovascular: Normal heart sounds.   Lymphadenopathy:    She has no cervical adenopathy.  Neurological: She is alert and oriented to person, place, and time. No cranial nerve deficit. Coordination normal.  Skin: Skin is warm and dry.  Nursing note and vitals reviewed.   ED Course  Procedures (including critical care time) Labs Review Labs Reviewed - No data to display  Imaging Review No results found.   MDM   1. ETD (eustachian tube dysfunction), bilateral       Billy Fischer, MD 02/04/15 [id number]

## 2015-02-04 NOTE — ED Notes (Signed)
Pt  Reports  Being  Dizzy         With  Bilateral earache       As   Well  As   Some  Nausea     Went  Swimming  Last  Week  And   Developed   Symptoms

## 2015-02-04 NOTE — Discharge Instructions (Signed)
Drink plenty of fluids, use medicine as prescribed, see your doctor if further problems.

## 2015-12-05 DIAGNOSIS — Z720 Tobacco use: Secondary | ICD-10-CM | POA: Diagnosis not present

## 2015-12-05 DIAGNOSIS — M5126 Other intervertebral disc displacement, lumbar region: Secondary | ICD-10-CM | POA: Diagnosis not present

## 2015-12-05 DIAGNOSIS — Z6833 Body mass index (BMI) 33.0-33.9, adult: Secondary | ICD-10-CM | POA: Diagnosis not present

## 2015-12-05 DIAGNOSIS — Z1389 Encounter for screening for other disorder: Secondary | ICD-10-CM | POA: Diagnosis not present

## 2015-12-05 DIAGNOSIS — E282 Polycystic ovarian syndrome: Secondary | ICD-10-CM | POA: Diagnosis not present

## 2015-12-09 ENCOUNTER — Ambulatory Visit (INDEPENDENT_AMBULATORY_CARE_PROVIDER_SITE_OTHER): Payer: BLUE CROSS/BLUE SHIELD | Admitting: Internal Medicine

## 2015-12-09 VITALS — BP 148/86 | HR 115 | Temp 99.1°F | Resp 18 | Ht 66.5 in | Wt 208.0 lb

## 2015-12-09 DIAGNOSIS — K122 Cellulitis and abscess of mouth: Secondary | ICD-10-CM | POA: Diagnosis not present

## 2015-12-09 LAB — POCT RAPID STREP A (OFFICE): Rapid Strep A Screen: NEGATIVE

## 2015-12-09 MED ORDER — AMOXICILLIN-POT CLAVULANATE 875-125 MG PO TABS: 1.0000 | ORAL_TABLET | Freq: Two times a day (BID) | ORAL | Status: DC

## 2015-12-09 NOTE — Patient Instructions (Signed)
     IF you received an x-ray today, you will receive an invoice from [facility]. Please contact [facility] at [phone] with questions or concerns regarding your invoice.   IF you received labwork today, you will receive an invoice from [facility]. Please contact [facility] at [phone] with questions or concerns regarding your invoice.   Our billing staff will not be able to assist you with questions regarding bills from these companies.  You will be contacted with the lab results as soon as they are available. The fastest way to get your results is to activate your My Chart account. Instructions are located on the last page of this paperwork. If you have not heard from us regarding the results in 2 weeks, please contact this office.      

## 2015-12-09 NOTE — Progress Notes (Addendum)
Subjective:  By signing my name below, I, Moises Blood, attest that this documentation has been prepared under the direction and in the presence of Tami Lin, MD. Electronically Signed: Moises Blood, [place]. 12/09/2015 , 9:04 AM .  Patient was seen in Room 11 .   Patient ID: Amanda Stokes, female    DOB: 12-09-1975, 40 y.o.   MRN: [id number] Chief Complaint  Patient presents with  . Sore Throat    X 1 DAY   HPI Amanda Stokes is a 40 y.o. female who presents to [facility] complaining of sore throat noticed when she woke up this morning. She looked and saw there were bumps and redness in the back of her throat. She reports normally have sinus issues but denies any drainage. She denies recent allergy symptoms, chills or sweat when she woke up today.   She also noticed soreness from her left lymph node under her ear ongoing for a couple months now. No ear problems.  She mentions her BP has been elevated since her hysterectomy. She was recently started on medication 4d ago with a new PCP, Dr. Ardeth Perfect at [facility]. She reports that her BP ran about 170/117 during prior visit with Dr. Ardeth Perfect.   She's not taking metformin because it makes her really nauseous (for PCOS).   Patient Active Problem List   Diagnosis Date Noted  . PCOS (polycystic ovarian syndrome) 02/23/2014  . Essential hypertension, benign 02/23/2014  . Endocrine alopecia 11/16/2013  . Biliary dyskinesia 06/02/2011  . Elevated liver function tests 04/11/2011    Current outpatient prescriptions:  .  gabapentin (NEURONTIN) 300 MG capsule, Take 300 mg by mouth 3 (three) times daily., Disp: , Rfl:  .  HYDROcodone-acetaminophen (NORCO/VICODIN) 5-325 MG per tablet, Take 1-2 tablets by mouth every 6 (six) hours as needed for moderate pain., Disp: , Rfl:  .  metFORMIN (GLUCOPHAGE-XR) 500 MG 24 hr tablet, Take 3 tablets (1,500 mg total) by mouth daily with supper., Disp: 90 tablet, Rfl: 3 .  tiZANidine (ZANAFLEX)  4 MG tablet, Take 4 mg by mouth once., Disp: , Rfl:  .  ibuprofen (ADVIL,MOTRIN) 200 MG tablet, Take 800 mg by mouth every 6 (six) hours as needed. Reported on 12/09/2015, Disp: , Rfl:  .  ipratropium (ATROVENT) 0.06 % nasal spray, Place 2 sprays into both nostrils 4 (four) times daily. (Patient not taking: Reported on 12/09/2015), Disp: 15 mL, Rfl: 1 .  promethazine (PHENERGAN) 25 MG tablet, Take 1 tablet (25 mg total) by mouth every 6 (six) hours as needed for nausea or vomiting. (Patient not taking: Reported on 12/09/2015), Disp: 10 tablet, Rfl: 0 .  spironolactone (ALDACTONE) 50 MG tablet, Take 1 tablet (50 mg total) by mouth daily. (Patient not taking: Reported on 12/09/2015), Disp: 30 tablet, Rfl: 0 .  ZIPSOR 25 MG CAPS, 25 mg every 6 (six) hours. Reported on 12/09/2015, Disp: , Rfl:  Allergies  Allergen Reactions  . Vicodin [Hydrocodone-Acetaminophen] Itching   Review of Systems  Constitutional: Negative for fever, chills and fatigue.  HENT: Positive for sore throat. Negative for congestion, rhinorrhea, sinus pressure and sneezing.   Respiratory: Negative for cough and shortness of breath.   Gastrointestinal: Negative for nausea and vomiting.  Hematological: Positive for adenopathy.      Objective:   Physical Exam  Constitutional: She is oriented to person, place, and time. She appears well-developed and well-nourished. No distress.  HENT:  Head: Normocephalic and atraumatic.  Left Ear: Tympanic membrane and external ear  normal.  Nose: Mucosal edema and rhinorrhea present.  uvula is swollen and red, general redness in the whole posterior pharynx but no exudate, no dental abscess or hypertrophy of steinsen's duct  Eyes: Conjunctivae and EOM are normal. Pupils are equal, round, and reactive to light.  Neck: Neck supple.  Cardiovascular: Normal rate and regular rhythm.   No murmur heard. Rate 80  Pulmonary/Chest: Effort normal. No respiratory distress.  Musculoskeletal: Normal range of  motion.  Lymphadenopathy:       Head (left side): Preauricular adenopathy present.  Soft slightly tender 1cm node at the angle of the jaw  Neurological: She is alert and oriented to person, place, and time.  Skin: Skin is warm and dry.  Psychiatric: She has a normal mood and affect. Her behavior is normal.  Nursing note and vitals reviewed.  Results for orders placed or performed in visit on 12/09/15  POCT rapid strep A  Result Value Ref Range   Rapid Strep A Screen Negative Negative    BP 148/86 mmHg  Pulse 115  Temp(Src) 99.1 F (37.3 C) (Oral)  Resp 18  Ht 5' 6.5" (1.689 m)  Wt 208 lb (94.348 kg)  BMI 33.07 kg/m2  SpO2 98%    Assessment & Plan:  I have completed the patient encounter in its entirety as documented by the scribe, with editing by me where necessary. Narcissus Detwiler P. Laney Pastor, M.D.  Uvulitis - Plan: POCT rapid strep A Cold liquids Meds ordered this encounter  Medications  . amoxicillin-clavulanate (AUGMENTIN) 875-125 MG tablet    Sig: Take 1 tablet by mouth 2 (two) times daily.    Dispense:  20 tablet    Refill:  0    Call w/ results Needs reeval--ENT consid for bx of L preauric node if not resolved after this treatment

## 2015-12-09 NOTE — Addendum Note (Signed)
Addended by: Leandrew Koyanagi on: 12/09/2015 04:21 PM   Modules accepted: Orders

## 2015-12-09 NOTE — Addendum Note (Signed)
Addended by: Lupe Carney on: 12/09/2015 09:50 AM   Modules accepted: Orders

## 2015-12-10 LAB — CULTURE, GROUP A STREP: Organism ID, Bacteria: NORMAL

## 2016-01-02 DIAGNOSIS — M5126 Other intervertebral disc displacement, lumbar region: Secondary | ICD-10-CM | POA: Diagnosis not present

## 2016-01-02 DIAGNOSIS — Z6833 Body mass index (BMI) 33.0-33.9, adult: Secondary | ICD-10-CM | POA: Diagnosis not present

## 2016-01-02 DIAGNOSIS — I10 Essential (primary) hypertension: Secondary | ICD-10-CM | POA: Diagnosis not present

## 2016-01-02 DIAGNOSIS — E282 Polycystic ovarian syndrome: Secondary | ICD-10-CM | POA: Diagnosis not present

## 2016-02-29 DIAGNOSIS — Z6833 Body mass index (BMI) 33.0-33.9, adult: Secondary | ICD-10-CM | POA: Diagnosis not present

## 2016-02-29 DIAGNOSIS — R1011 Right upper quadrant pain: Secondary | ICD-10-CM | POA: Diagnosis not present

## 2016-02-29 DIAGNOSIS — M5414 Radiculopathy, thoracic region: Secondary | ICD-10-CM | POA: Diagnosis not present

## 2016-02-29 DIAGNOSIS — M5134 Other intervertebral disc degeneration, thoracic region: Secondary | ICD-10-CM | POA: Diagnosis not present

## 2016-03-05 ENCOUNTER — Ambulatory Visit
Admission: RE | Admit: 2016-03-05 | Discharge: 2016-03-05 | Disposition: A | Discharge status: Home / Self Care | Payer: BLUE CROSS/BLUE SHIELD | Source: Ambulatory Visit | Attending: Internal Medicine | Admitting: Internal Medicine

## 2016-03-05 ENCOUNTER — Other Ambulatory Visit: Payer: Self-pay | Admitting: Internal Medicine

## 2016-03-05 DIAGNOSIS — R7989 Other specified abnormal findings of blood chemistry: Secondary | ICD-10-CM

## 2016-03-05 DIAGNOSIS — R1011 Right upper quadrant pain: Secondary | ICD-10-CM | POA: Diagnosis not present

## 2016-03-05 DIAGNOSIS — R945 Abnormal results of liver function studies: Principal | ICD-10-CM

## 2016-03-10 DIAGNOSIS — Z0000 Encounter for general adult medical examination without abnormal findings: Secondary | ICD-10-CM | POA: Diagnosis not present

## 2016-03-10 DIAGNOSIS — I10 Essential (primary) hypertension: Secondary | ICD-10-CM | POA: Diagnosis not present

## 2016-03-17 DIAGNOSIS — R0789 Other chest pain: Secondary | ICD-10-CM | POA: Diagnosis not present

## 2016-03-17 DIAGNOSIS — R591 Generalized enlarged lymph nodes: Secondary | ICD-10-CM | POA: Diagnosis not present

## 2016-03-17 DIAGNOSIS — Z0000 Encounter for general adult medical examination without abnormal findings: Secondary | ICD-10-CM | POA: Diagnosis not present

## 2016-03-17 DIAGNOSIS — I10 Essential (primary) hypertension: Secondary | ICD-10-CM | POA: Diagnosis not present

## 2016-03-17 DIAGNOSIS — Z1389 Encounter for screening for other disorder: Secondary | ICD-10-CM | POA: Diagnosis not present

## 2016-03-17 DIAGNOSIS — R197 Diarrhea, unspecified: Secondary | ICD-10-CM | POA: Diagnosis not present

## 2016-03-17 DIAGNOSIS — Z23 Encounter for immunization: Secondary | ICD-10-CM | POA: Diagnosis not present

## 2016-04-24 ENCOUNTER — Other Ambulatory Visit: Payer: Self-pay | Admitting: Obstetrics and Gynecology

## 2016-04-24 DIAGNOSIS — E282 Polycystic ovarian syndrome: Secondary | ICD-10-CM | POA: Diagnosis not present

## 2016-04-24 DIAGNOSIS — N644 Mastodynia: Secondary | ICD-10-CM

## 2016-04-24 DIAGNOSIS — Z01419 Encounter for gynecological examination (general) (routine) without abnormal findings: Secondary | ICD-10-CM | POA: Diagnosis not present

## 2016-04-24 DIAGNOSIS — Z6833 Body mass index (BMI) 33.0-33.9, adult: Secondary | ICD-10-CM | POA: Diagnosis not present

## 2016-04-29 ENCOUNTER — Ambulatory Visit
Admission: RE | Admit: 2016-04-29 | Discharge: 2016-04-29 | Disposition: A | Discharge status: Home / Self Care | Payer: BLUE CROSS/BLUE SHIELD | Source: Ambulatory Visit | Attending: Obstetrics and Gynecology | Admitting: Obstetrics and Gynecology

## 2016-04-29 DIAGNOSIS — N644 Mastodynia: Secondary | ICD-10-CM

## 2016-06-25 DIAGNOSIS — J019 Acute sinusitis, unspecified: Secondary | ICD-10-CM | POA: Diagnosis not present

## 2016-06-25 DIAGNOSIS — H68002 Unspecified Eustachian salpingitis, left ear: Secondary | ICD-10-CM | POA: Diagnosis not present

## 2016-06-25 DIAGNOSIS — R5381 Other malaise: Secondary | ICD-10-CM | POA: Diagnosis not present

## 2016-06-25 DIAGNOSIS — J029 Acute pharyngitis, unspecified: Secondary | ICD-10-CM | POA: Diagnosis not present

## 2016-07-25 DIAGNOSIS — N6012 Diffuse cystic mastopathy of left breast: Secondary | ICD-10-CM | POA: Diagnosis not present

## 2016-07-25 DIAGNOSIS — Z6830 Body mass index (BMI) 30.0-30.9, adult: Secondary | ICD-10-CM | POA: Diagnosis not present

## 2016-07-25 DIAGNOSIS — E6609 Other obesity due to excess calories: Secondary | ICD-10-CM | POA: Diagnosis not present

## 2016-10-03 DIAGNOSIS — R5381 Other malaise: Secondary | ICD-10-CM | POA: Diagnosis not present

## 2016-10-03 DIAGNOSIS — F418 Other specified anxiety disorders: Secondary | ICD-10-CM | POA: Diagnosis not present

## 2016-10-03 DIAGNOSIS — F4320 Adjustment disorder, unspecified: Secondary | ICD-10-CM | POA: Diagnosis not present

## 2016-10-03 DIAGNOSIS — Z6829 Body mass index (BMI) 29.0-29.9, adult: Secondary | ICD-10-CM | POA: Diagnosis not present

## 2016-10-07 DIAGNOSIS — F4320 Adjustment disorder, unspecified: Secondary | ICD-10-CM | POA: Diagnosis not present

## 2016-11-02 DIAGNOSIS — B370 Candidal stomatitis: Secondary | ICD-10-CM | POA: Diagnosis not present

## 2016-11-05 DIAGNOSIS — J3089 Other allergic rhinitis: Secondary | ICD-10-CM | POA: Diagnosis not present

## 2016-11-05 DIAGNOSIS — F418 Other specified anxiety disorders: Secondary | ICD-10-CM | POA: Diagnosis not present

## 2016-11-05 DIAGNOSIS — B370 Candidal stomatitis: Secondary | ICD-10-CM | POA: Diagnosis not present

## 2016-11-05 DIAGNOSIS — H68002 Unspecified Eustachian salpingitis, left ear: Secondary | ICD-10-CM | POA: Diagnosis not present

## 2016-12-08 DIAGNOSIS — F17200 Nicotine dependence, unspecified, uncomplicated: Secondary | ICD-10-CM | POA: Insufficient documentation

## 2016-12-08 DIAGNOSIS — M26629 Arthralgia of temporomandibular joint, unspecified side: Secondary | ICD-10-CM | POA: Insufficient documentation

## 2016-12-08 DIAGNOSIS — M2669 Other specified disorders of temporomandibular joint: Secondary | ICD-10-CM | POA: Diagnosis not present

## 2016-12-08 DIAGNOSIS — K219 Gastro-esophageal reflux disease without esophagitis: Secondary | ICD-10-CM | POA: Insufficient documentation

## 2016-12-08 DIAGNOSIS — F17210 Nicotine dependence, cigarettes, uncomplicated: Secondary | ICD-10-CM | POA: Diagnosis not present

## 2016-12-10 DIAGNOSIS — S338XXA Sprain of other parts of lumbar spine and pelvis, initial encounter: Secondary | ICD-10-CM | POA: Diagnosis not present

## 2016-12-12 DIAGNOSIS — S338XXA Sprain of other parts of lumbar spine and pelvis, initial encounter: Secondary | ICD-10-CM | POA: Diagnosis not present

## 2016-12-18 DIAGNOSIS — S338XXA Sprain of other parts of lumbar spine and pelvis, initial encounter: Secondary | ICD-10-CM | POA: Diagnosis not present

## 2016-12-22 DIAGNOSIS — S338XXA Sprain of other parts of lumbar spine and pelvis, initial encounter: Secondary | ICD-10-CM | POA: Diagnosis not present

## 2016-12-26 DIAGNOSIS — S336XXA Sprain of sacroiliac joint, initial encounter: Secondary | ICD-10-CM | POA: Diagnosis not present

## 2016-12-26 DIAGNOSIS — M5410 Radiculopathy, site unspecified: Secondary | ICD-10-CM | POA: Diagnosis not present

## 2017-01-01 DIAGNOSIS — S336XXA Sprain of sacroiliac joint, initial encounter: Secondary | ICD-10-CM | POA: Diagnosis not present

## 2017-01-01 DIAGNOSIS — M5410 Radiculopathy, site unspecified: Secondary | ICD-10-CM | POA: Diagnosis not present

## 2017-01-06 DIAGNOSIS — M5410 Radiculopathy, site unspecified: Secondary | ICD-10-CM | POA: Diagnosis not present

## 2017-01-06 DIAGNOSIS — S336XXA Sprain of sacroiliac joint, initial encounter: Secondary | ICD-10-CM | POA: Diagnosis not present

## 2017-01-13 DIAGNOSIS — S336XXA Sprain of sacroiliac joint, initial encounter: Secondary | ICD-10-CM | POA: Diagnosis not present

## 2017-01-13 DIAGNOSIS — M5410 Radiculopathy, site unspecified: Secondary | ICD-10-CM | POA: Diagnosis not present

## 2017-01-14 DIAGNOSIS — Z6831 Body mass index (BMI) 31.0-31.9, adult: Secondary | ICD-10-CM | POA: Diagnosis not present

## 2017-01-14 DIAGNOSIS — Z713 Dietary counseling and surveillance: Secondary | ICD-10-CM | POA: Diagnosis not present

## 2017-01-20 DIAGNOSIS — M5410 Radiculopathy, site unspecified: Secondary | ICD-10-CM | POA: Diagnosis not present

## 2017-01-20 DIAGNOSIS — S336XXA Sprain of sacroiliac joint, initial encounter: Secondary | ICD-10-CM | POA: Diagnosis not present

## 2017-01-27 DIAGNOSIS — S336XXA Sprain of sacroiliac joint, initial encounter: Secondary | ICD-10-CM | POA: Diagnosis not present

## 2017-01-27 DIAGNOSIS — M5410 Radiculopathy, site unspecified: Secondary | ICD-10-CM | POA: Diagnosis not present

## 2017-02-06 DIAGNOSIS — S336XXA Sprain of sacroiliac joint, initial encounter: Secondary | ICD-10-CM | POA: Diagnosis not present

## 2017-02-06 DIAGNOSIS — M5410 Radiculopathy, site unspecified: Secondary | ICD-10-CM | POA: Diagnosis not present

## 2017-02-10 DIAGNOSIS — S336XXA Sprain of sacroiliac joint, initial encounter: Secondary | ICD-10-CM | POA: Diagnosis not present

## 2017-02-10 DIAGNOSIS — M5410 Radiculopathy, site unspecified: Secondary | ICD-10-CM | POA: Diagnosis not present

## 2017-03-03 DIAGNOSIS — M5410 Radiculopathy, site unspecified: Secondary | ICD-10-CM | POA: Diagnosis not present

## 2017-03-03 DIAGNOSIS — S336XXA Sprain of sacroiliac joint, initial encounter: Secondary | ICD-10-CM | POA: Diagnosis not present

## 2017-03-07 DIAGNOSIS — M5410 Radiculopathy, site unspecified: Secondary | ICD-10-CM | POA: Diagnosis not present

## 2017-03-07 DIAGNOSIS — S336XXA Sprain of sacroiliac joint, initial encounter: Secondary | ICD-10-CM | POA: Diagnosis not present

## 2017-03-13 DIAGNOSIS — M5410 Radiculopathy, site unspecified: Secondary | ICD-10-CM | POA: Diagnosis not present

## 2017-03-13 DIAGNOSIS — S336XXA Sprain of sacroiliac joint, initial encounter: Secondary | ICD-10-CM | POA: Diagnosis not present

## 2017-03-17 DIAGNOSIS — Z0000 Encounter for general adult medical examination without abnormal findings: Secondary | ICD-10-CM | POA: Diagnosis not present

## 2017-03-17 DIAGNOSIS — R8299 Other abnormal findings in urine: Secondary | ICD-10-CM | POA: Diagnosis not present

## 2017-03-17 DIAGNOSIS — I10 Essential (primary) hypertension: Secondary | ICD-10-CM | POA: Diagnosis not present

## 2017-03-24 DIAGNOSIS — Z1389 Encounter for screening for other disorder: Secondary | ICD-10-CM | POA: Diagnosis not present

## 2017-03-24 DIAGNOSIS — F418 Other specified anxiety disorders: Secondary | ICD-10-CM | POA: Diagnosis not present

## 2017-03-24 DIAGNOSIS — I10 Essential (primary) hypertension: Secondary | ICD-10-CM | POA: Diagnosis not present

## 2017-03-24 DIAGNOSIS — Z0000 Encounter for general adult medical examination without abnormal findings: Secondary | ICD-10-CM | POA: Diagnosis not present

## 2017-03-24 DIAGNOSIS — Z6830 Body mass index (BMI) 30.0-30.9, adult: Secondary | ICD-10-CM | POA: Diagnosis not present

## 2017-03-24 DIAGNOSIS — K760 Fatty (change of) liver, not elsewhere classified: Secondary | ICD-10-CM | POA: Diagnosis not present

## 2017-03-24 DIAGNOSIS — R591 Generalized enlarged lymph nodes: Secondary | ICD-10-CM | POA: Diagnosis not present

## 2017-04-02 DIAGNOSIS — J028 Acute pharyngitis due to other specified organisms: Secondary | ICD-10-CM | POA: Diagnosis not present

## 2017-04-02 DIAGNOSIS — Z6830 Body mass index (BMI) 30.0-30.9, adult: Secondary | ICD-10-CM | POA: Diagnosis not present

## 2017-04-02 DIAGNOSIS — R5383 Other fatigue: Secondary | ICD-10-CM | POA: Diagnosis not present

## 2017-06-30 ENCOUNTER — Other Ambulatory Visit: Payer: Self-pay | Admitting: Obstetrics and Gynecology

## 2017-06-30 DIAGNOSIS — Z1231 Encounter for screening mammogram for malignant neoplasm of breast: Secondary | ICD-10-CM

## 2017-07-17 DIAGNOSIS — M5416 Radiculopathy, lumbar region: Secondary | ICD-10-CM | POA: Diagnosis not present

## 2017-07-19 DIAGNOSIS — J0100 Acute maxillary sinusitis, unspecified: Secondary | ICD-10-CM | POA: Diagnosis not present

## 2017-07-19 DIAGNOSIS — J209 Acute bronchitis, unspecified: Secondary | ICD-10-CM | POA: Diagnosis not present

## 2017-07-21 DIAGNOSIS — M791 Myalgia, unspecified site: Secondary | ICD-10-CM | POA: Diagnosis not present

## 2017-07-21 DIAGNOSIS — J111 Influenza due to unidentified influenza virus with other respiratory manifestations: Secondary | ICD-10-CM | POA: Diagnosis not present

## 2017-07-21 DIAGNOSIS — J09X2 Influenza due to identified novel influenza A virus with other respiratory manifestations: Secondary | ICD-10-CM | POA: Diagnosis not present

## 2017-07-28 ENCOUNTER — Ambulatory Visit
Admission: RE | Admit: 2017-07-28 | Discharge: 2017-07-28 | Disposition: A | Discharge status: Home / Self Care | Payer: BLUE CROSS/BLUE SHIELD | Source: Ambulatory Visit | Attending: Obstetrics and Gynecology | Admitting: Obstetrics and Gynecology

## 2017-07-28 DIAGNOSIS — Z1231 Encounter for screening mammogram for malignant neoplasm of breast: Secondary | ICD-10-CM

## 2017-09-02 DIAGNOSIS — Z6832 Body mass index (BMI) 32.0-32.9, adult: Secondary | ICD-10-CM | POA: Diagnosis not present

## 2017-09-02 DIAGNOSIS — Z01419 Encounter for gynecological examination (general) (routine) without abnormal findings: Secondary | ICD-10-CM | POA: Diagnosis not present

## 2017-09-02 DIAGNOSIS — Z1212 Encounter for screening for malignant neoplasm of rectum: Secondary | ICD-10-CM | POA: Diagnosis not present

## 2017-10-12 DIAGNOSIS — J029 Acute pharyngitis, unspecified: Secondary | ICD-10-CM | POA: Diagnosis not present

## 2017-12-30 DIAGNOSIS — K649 Unspecified hemorrhoids: Secondary | ICD-10-CM | POA: Diagnosis not present

## 2017-12-30 DIAGNOSIS — R05 Cough: Secondary | ICD-10-CM | POA: Diagnosis not present

## 2017-12-30 DIAGNOSIS — K219 Gastro-esophageal reflux disease without esophagitis: Secondary | ICD-10-CM | POA: Diagnosis not present

## 2017-12-30 DIAGNOSIS — J309 Allergic rhinitis, unspecified: Secondary | ICD-10-CM | POA: Diagnosis not present

## 2018-03-24 DIAGNOSIS — Z0000 Encounter for general adult medical examination without abnormal findings: Secondary | ICD-10-CM | POA: Diagnosis not present

## 2018-03-24 DIAGNOSIS — R82998 Other abnormal findings in urine: Secondary | ICD-10-CM | POA: Diagnosis not present

## 2018-03-24 DIAGNOSIS — I10 Essential (primary) hypertension: Secondary | ICD-10-CM | POA: Diagnosis not present

## 2018-03-31 DIAGNOSIS — I10 Essential (primary) hypertension: Secondary | ICD-10-CM | POA: Diagnosis not present

## 2018-03-31 DIAGNOSIS — R05 Cough: Secondary | ICD-10-CM | POA: Diagnosis not present

## 2018-03-31 DIAGNOSIS — Z0000 Encounter for general adult medical examination without abnormal findings: Secondary | ICD-10-CM | POA: Diagnosis not present

## 2018-03-31 DIAGNOSIS — Z1389 Encounter for screening for other disorder: Secondary | ICD-10-CM | POA: Diagnosis not present

## 2018-04-02 ENCOUNTER — Other Ambulatory Visit: Payer: Self-pay | Admitting: Internal Medicine

## 2018-04-02 DIAGNOSIS — M5414 Radiculopathy, thoracic region: Secondary | ICD-10-CM

## 2018-04-02 DIAGNOSIS — M5126 Other intervertebral disc displacement, lumbar region: Secondary | ICD-10-CM

## 2018-04-20 ENCOUNTER — Ambulatory Visit
Admission: RE | Admit: 2018-04-20 | Discharge: 2018-04-20 | Disposition: A | Discharge status: Home / Self Care | Payer: BLUE CROSS/BLUE SHIELD | Source: Ambulatory Visit | Attending: Internal Medicine | Admitting: Internal Medicine

## 2018-04-20 DIAGNOSIS — M48061 Spinal stenosis, lumbar region without neurogenic claudication: Secondary | ICD-10-CM | POA: Diagnosis not present

## 2018-04-20 DIAGNOSIS — M5414 Radiculopathy, thoracic region: Secondary | ICD-10-CM

## 2018-04-20 DIAGNOSIS — M5126 Other intervertebral disc displacement, lumbar region: Secondary | ICD-10-CM | POA: Diagnosis not present

## 2018-04-20 DIAGNOSIS — M5134 Other intervertebral disc degeneration, thoracic region: Secondary | ICD-10-CM | POA: Diagnosis not present

## 2018-04-20 MED ORDER — GADOBENATE DIMEGLUMINE 529 MG/ML IV SOLN
19.0000 mL | Freq: Once | INTRAVENOUS | Status: AC | PRN
  Administered 2018-04-20: 19 mL via INTRAVENOUS

## 2018-05-17 DIAGNOSIS — M545 Low back pain: Secondary | ICD-10-CM | POA: Diagnosis not present

## 2018-06-08 DIAGNOSIS — H1012 Acute atopic conjunctivitis, left eye: Secondary | ICD-10-CM | POA: Diagnosis not present

## 2018-07-08 DIAGNOSIS — J0100 Acute maxillary sinusitis, unspecified: Secondary | ICD-10-CM | POA: Diagnosis not present

## 2018-07-08 DIAGNOSIS — I10 Essential (primary) hypertension: Secondary | ICD-10-CM | POA: Diagnosis not present

## 2018-07-08 DIAGNOSIS — J309 Allergic rhinitis, unspecified: Secondary | ICD-10-CM | POA: Diagnosis not present

## 2018-07-08 DIAGNOSIS — R52 Pain, unspecified: Secondary | ICD-10-CM | POA: Diagnosis not present

## 2018-07-08 DIAGNOSIS — R05 Cough: Secondary | ICD-10-CM | POA: Diagnosis not present

## 2018-09-27 ENCOUNTER — Other Ambulatory Visit: Payer: Self-pay | Admitting: Obstetrics and Gynecology

## 2018-09-27 DIAGNOSIS — Z1231 Encounter for screening mammogram for malignant neoplasm of breast: Secondary | ICD-10-CM

## 2018-10-08 ENCOUNTER — Ambulatory Visit
Admission: RE | Admit: 2018-10-08 | Discharge: 2018-10-08 | Disposition: A | Discharge status: Home / Self Care | Payer: BLUE CROSS/BLUE SHIELD | Source: Ambulatory Visit | Attending: Obstetrics and Gynecology | Admitting: Obstetrics and Gynecology

## 2018-10-08 DIAGNOSIS — Z1231 Encounter for screening mammogram for malignant neoplasm of breast: Secondary | ICD-10-CM | POA: Diagnosis not present

## 2018-10-11 DIAGNOSIS — Z6832 Body mass index (BMI) 32.0-32.9, adult: Secondary | ICD-10-CM | POA: Diagnosis not present

## 2018-10-11 DIAGNOSIS — Z01419 Encounter for gynecological examination (general) (routine) without abnormal findings: Secondary | ICD-10-CM | POA: Diagnosis not present

## 2018-12-30 DIAGNOSIS — M9903 Segmental and somatic dysfunction of lumbar region: Secondary | ICD-10-CM | POA: Diagnosis not present

## 2018-12-30 DIAGNOSIS — M5431 Sciatica, right side: Secondary | ICD-10-CM | POA: Diagnosis not present

## 2019-01-04 DIAGNOSIS — M5431 Sciatica, right side: Secondary | ICD-10-CM | POA: Diagnosis not present

## 2019-01-04 DIAGNOSIS — M9903 Segmental and somatic dysfunction of lumbar region: Secondary | ICD-10-CM | POA: Diagnosis not present

## 2019-01-06 DIAGNOSIS — M9903 Segmental and somatic dysfunction of lumbar region: Secondary | ICD-10-CM | POA: Diagnosis not present

## 2019-01-06 DIAGNOSIS — M5431 Sciatica, right side: Secondary | ICD-10-CM | POA: Diagnosis not present

## 2019-01-11 DIAGNOSIS — M9903 Segmental and somatic dysfunction of lumbar region: Secondary | ICD-10-CM | POA: Diagnosis not present

## 2019-01-11 DIAGNOSIS — M5431 Sciatica, right side: Secondary | ICD-10-CM | POA: Diagnosis not present

## 2019-01-13 DIAGNOSIS — M9903 Segmental and somatic dysfunction of lumbar region: Secondary | ICD-10-CM | POA: Diagnosis not present

## 2019-01-13 DIAGNOSIS — M5431 Sciatica, right side: Secondary | ICD-10-CM | POA: Diagnosis not present

## 2019-01-17 DIAGNOSIS — M5431 Sciatica, right side: Secondary | ICD-10-CM | POA: Diagnosis not present

## 2019-01-17 DIAGNOSIS — M9903 Segmental and somatic dysfunction of lumbar region: Secondary | ICD-10-CM | POA: Diagnosis not present

## 2019-01-19 DIAGNOSIS — M9903 Segmental and somatic dysfunction of lumbar region: Secondary | ICD-10-CM | POA: Diagnosis not present

## 2019-01-19 DIAGNOSIS — M5431 Sciatica, right side: Secondary | ICD-10-CM | POA: Diagnosis not present

## 2019-01-25 DIAGNOSIS — M5431 Sciatica, right side: Secondary | ICD-10-CM | POA: Diagnosis not present

## 2019-01-25 DIAGNOSIS — M9903 Segmental and somatic dysfunction of lumbar region: Secondary | ICD-10-CM | POA: Diagnosis not present

## 2019-01-26 DIAGNOSIS — M5431 Sciatica, right side: Secondary | ICD-10-CM | POA: Diagnosis not present

## 2019-01-26 DIAGNOSIS — M9903 Segmental and somatic dysfunction of lumbar region: Secondary | ICD-10-CM | POA: Diagnosis not present

## 2019-01-31 DIAGNOSIS — M5431 Sciatica, right side: Secondary | ICD-10-CM | POA: Diagnosis not present

## 2019-01-31 DIAGNOSIS — M9903 Segmental and somatic dysfunction of lumbar region: Secondary | ICD-10-CM | POA: Diagnosis not present

## 2019-02-03 DIAGNOSIS — M9903 Segmental and somatic dysfunction of lumbar region: Secondary | ICD-10-CM | POA: Diagnosis not present

## 2019-02-03 DIAGNOSIS — M5431 Sciatica, right side: Secondary | ICD-10-CM | POA: Diagnosis not present

## 2019-02-14 DIAGNOSIS — M9903 Segmental and somatic dysfunction of lumbar region: Secondary | ICD-10-CM | POA: Diagnosis not present

## 2019-02-14 DIAGNOSIS — M5431 Sciatica, right side: Secondary | ICD-10-CM | POA: Diagnosis not present

## 2019-03-07 DIAGNOSIS — M9903 Segmental and somatic dysfunction of lumbar region: Secondary | ICD-10-CM | POA: Diagnosis not present

## 2019-03-07 DIAGNOSIS — M5431 Sciatica, right side: Secondary | ICD-10-CM | POA: Diagnosis not present

## 2019-03-21 DIAGNOSIS — M9903 Segmental and somatic dysfunction of lumbar region: Secondary | ICD-10-CM | POA: Diagnosis not present

## 2019-03-21 DIAGNOSIS — M5431 Sciatica, right side: Secondary | ICD-10-CM | POA: Diagnosis not present

## 2019-03-30 DIAGNOSIS — I10 Essential (primary) hypertension: Secondary | ICD-10-CM | POA: Diagnosis not present

## 2019-03-30 DIAGNOSIS — Z0000 Encounter for general adult medical examination without abnormal findings: Secondary | ICD-10-CM | POA: Diagnosis not present

## 2019-03-30 DIAGNOSIS — R7989 Other specified abnormal findings of blood chemistry: Secondary | ICD-10-CM | POA: Diagnosis not present

## 2019-04-04 DIAGNOSIS — M5431 Sciatica, right side: Secondary | ICD-10-CM | POA: Diagnosis not present

## 2019-04-04 DIAGNOSIS — M9903 Segmental and somatic dysfunction of lumbar region: Secondary | ICD-10-CM | POA: Diagnosis not present

## 2019-04-13 DIAGNOSIS — I10 Essential (primary) hypertension: Secondary | ICD-10-CM | POA: Diagnosis not present

## 2019-04-13 DIAGNOSIS — R82998 Other abnormal findings in urine: Secondary | ICD-10-CM | POA: Diagnosis not present

## 2019-04-18 DIAGNOSIS — I10 Essential (primary) hypertension: Secondary | ICD-10-CM | POA: Diagnosis not present

## 2019-04-18 DIAGNOSIS — Z720 Tobacco use: Secondary | ICD-10-CM | POA: Diagnosis not present

## 2019-04-18 DIAGNOSIS — Z0000 Encounter for general adult medical examination without abnormal findings: Secondary | ICD-10-CM | POA: Diagnosis not present

## 2019-04-18 DIAGNOSIS — K760 Fatty (change of) liver, not elsewhere classified: Secondary | ICD-10-CM | POA: Diagnosis not present

## 2019-04-18 DIAGNOSIS — Z1331 Encounter for screening for depression: Secondary | ICD-10-CM | POA: Diagnosis not present

## 2019-04-18 DIAGNOSIS — K219 Gastro-esophageal reflux disease without esophagitis: Secondary | ICD-10-CM | POA: Diagnosis not present

## 2019-04-20 DIAGNOSIS — Z1159 Encounter for screening for other viral diseases: Secondary | ICD-10-CM | POA: Diagnosis not present

## 2019-05-09 DIAGNOSIS — M9903 Segmental and somatic dysfunction of lumbar region: Secondary | ICD-10-CM | POA: Diagnosis not present

## 2019-05-09 DIAGNOSIS — M5431 Sciatica, right side: Secondary | ICD-10-CM | POA: Diagnosis not present

## 2019-05-30 DIAGNOSIS — M5431 Sciatica, right side: Secondary | ICD-10-CM | POA: Diagnosis not present

## 2019-05-30 DIAGNOSIS — M9903 Segmental and somatic dysfunction of lumbar region: Secondary | ICD-10-CM | POA: Diagnosis not present

## 2019-06-08 DIAGNOSIS — L729 Follicular cyst of the skin and subcutaneous tissue, unspecified: Secondary | ICD-10-CM | POA: Diagnosis not present

## 2019-06-14 DIAGNOSIS — R222 Localized swelling, mass and lump, trunk: Secondary | ICD-10-CM | POA: Diagnosis not present

## 2019-06-20 DIAGNOSIS — M9903 Segmental and somatic dysfunction of lumbar region: Secondary | ICD-10-CM | POA: Diagnosis not present

## 2019-06-20 DIAGNOSIS — M5431 Sciatica, right side: Secondary | ICD-10-CM | POA: Diagnosis not present

## 2019-06-23 DIAGNOSIS — R222 Localized swelling, mass and lump, trunk: Secondary | ICD-10-CM | POA: Diagnosis not present

## 2019-06-29 ENCOUNTER — Other Ambulatory Visit: Payer: Self-pay

## 2019-06-29 DIAGNOSIS — Z20822 Contact with and (suspected) exposure to covid-19: Secondary | ICD-10-CM

## 2019-07-01 LAB — NOVEL CORONAVIRUS, NAA: SARS-CoV-2, NAA: NOT DETECTED

## 2019-08-30 DIAGNOSIS — L723 Sebaceous cyst: Secondary | ICD-10-CM | POA: Diagnosis not present

## 2019-08-30 DIAGNOSIS — C44519 Basal cell carcinoma of skin of other part of trunk: Secondary | ICD-10-CM | POA: Diagnosis not present

## 2019-08-30 DIAGNOSIS — D2362 Other benign neoplasm of skin of left upper limb, including shoulder: Secondary | ICD-10-CM | POA: Diagnosis not present

## 2019-08-30 DIAGNOSIS — D2372 Other benign neoplasm of skin of left lower limb, including hip: Secondary | ICD-10-CM | POA: Diagnosis not present

## 2019-08-30 DIAGNOSIS — L661 Lichen planopilaris: Secondary | ICD-10-CM | POA: Diagnosis not present

## 2019-09-08 ENCOUNTER — Other Ambulatory Visit: Payer: Self-pay | Admitting: Obstetrics and Gynecology

## 2019-09-08 DIAGNOSIS — Z1231 Encounter for screening mammogram for malignant neoplasm of breast: Secondary | ICD-10-CM

## 2019-10-12 ENCOUNTER — Other Ambulatory Visit: Payer: Self-pay

## 2019-10-12 ENCOUNTER — Ambulatory Visit
Admission: RE | Admit: 2019-10-12 | Discharge: 2019-10-12 | Disposition: A | Discharge status: Home / Self Care | Payer: BC Managed Care – PPO | Source: Ambulatory Visit | Attending: Obstetrics and Gynecology | Admitting: Obstetrics and Gynecology

## 2019-10-12 DIAGNOSIS — Z1231 Encounter for screening mammogram for malignant neoplasm of breast: Secondary | ICD-10-CM | POA: Diagnosis not present

## 2019-10-14 DIAGNOSIS — R6882 Decreased libido: Secondary | ICD-10-CM | POA: Diagnosis not present

## 2019-10-14 DIAGNOSIS — Z6831 Body mass index (BMI) 31.0-31.9, adult: Secondary | ICD-10-CM | POA: Diagnosis not present

## 2019-10-14 DIAGNOSIS — Z01419 Encounter for gynecological examination (general) (routine) without abnormal findings: Secondary | ICD-10-CM | POA: Diagnosis not present

## 2019-10-14 DIAGNOSIS — N951 Menopausal and female climacteric states: Secondary | ICD-10-CM | POA: Diagnosis not present

## 2019-10-14 DIAGNOSIS — N952 Postmenopausal atrophic vaginitis: Secondary | ICD-10-CM | POA: Diagnosis not present

## 2019-10-20 DIAGNOSIS — R6882 Decreased libido: Secondary | ICD-10-CM | POA: Diagnosis not present

## 2019-11-21 DIAGNOSIS — R6882 Decreased libido: Secondary | ICD-10-CM | POA: Diagnosis not present

## 2019-12-08 DIAGNOSIS — Z85828 Personal history of other malignant neoplasm of skin: Secondary | ICD-10-CM | POA: Diagnosis not present

## 2019-12-08 DIAGNOSIS — L661 Lichen planopilaris: Secondary | ICD-10-CM | POA: Diagnosis not present

## 2019-12-19 DIAGNOSIS — R6882 Decreased libido: Secondary | ICD-10-CM | POA: Diagnosis not present

## 2020-03-01 DIAGNOSIS — D2361 Other benign neoplasm of skin of right upper limb, including shoulder: Secondary | ICD-10-CM | POA: Diagnosis not present

## 2020-03-01 DIAGNOSIS — L661 Lichen planopilaris: Secondary | ICD-10-CM | POA: Diagnosis not present

## 2020-03-01 DIAGNOSIS — D235 Other benign neoplasm of skin of trunk: Secondary | ICD-10-CM | POA: Diagnosis not present

## 2020-03-01 DIAGNOSIS — D485 Neoplasm of uncertain behavior of skin: Secondary | ICD-10-CM | POA: Diagnosis not present

## 2020-03-25 DIAGNOSIS — Z20822 Contact with and (suspected) exposure to covid-19: Secondary | ICD-10-CM | POA: Diagnosis not present

## 2020-04-03 IMAGING — MG DIGITAL SCREENING BILAT W/ TOMO W/ CAD
6 of 10 series · 6 of 30 positions shown · non-contrast
Comparison: Previous exam(s).

CLINICAL DATA: Screening.

EXAM:
DIGITAL SCREENING BILATERAL MAMMOGRAM WITH TOMO AND CAD

[R MLO synth-2D]
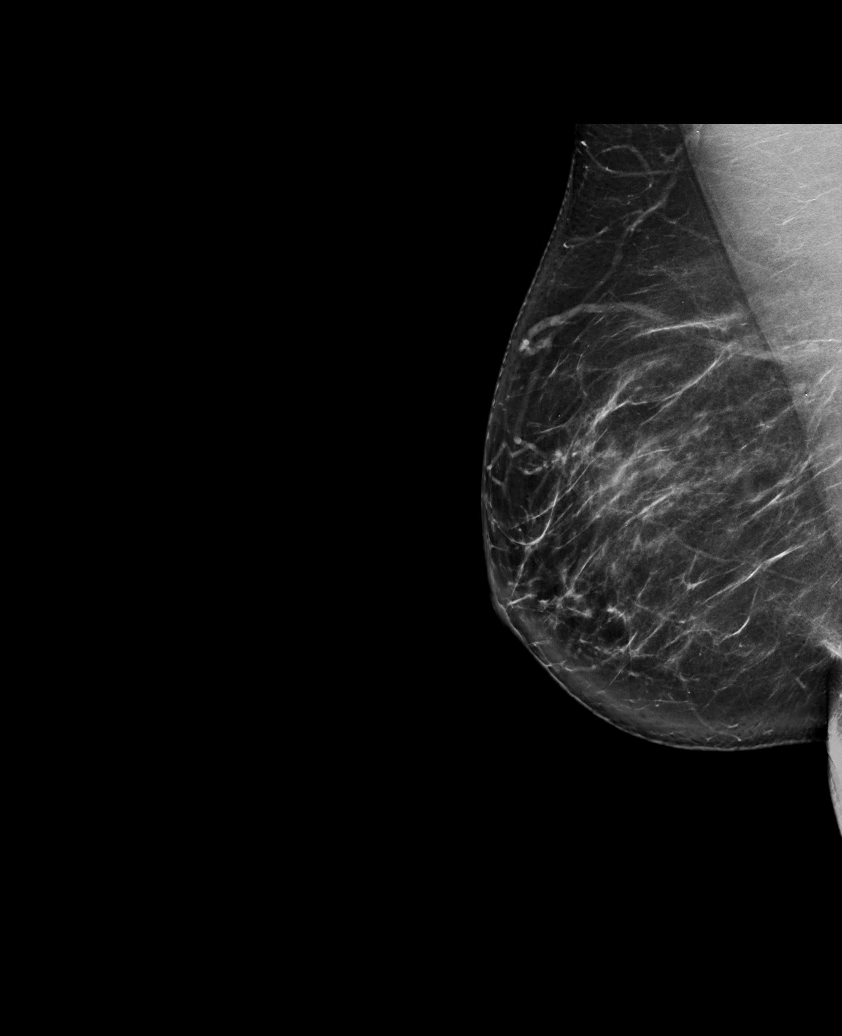

[L MLO synth-2D]
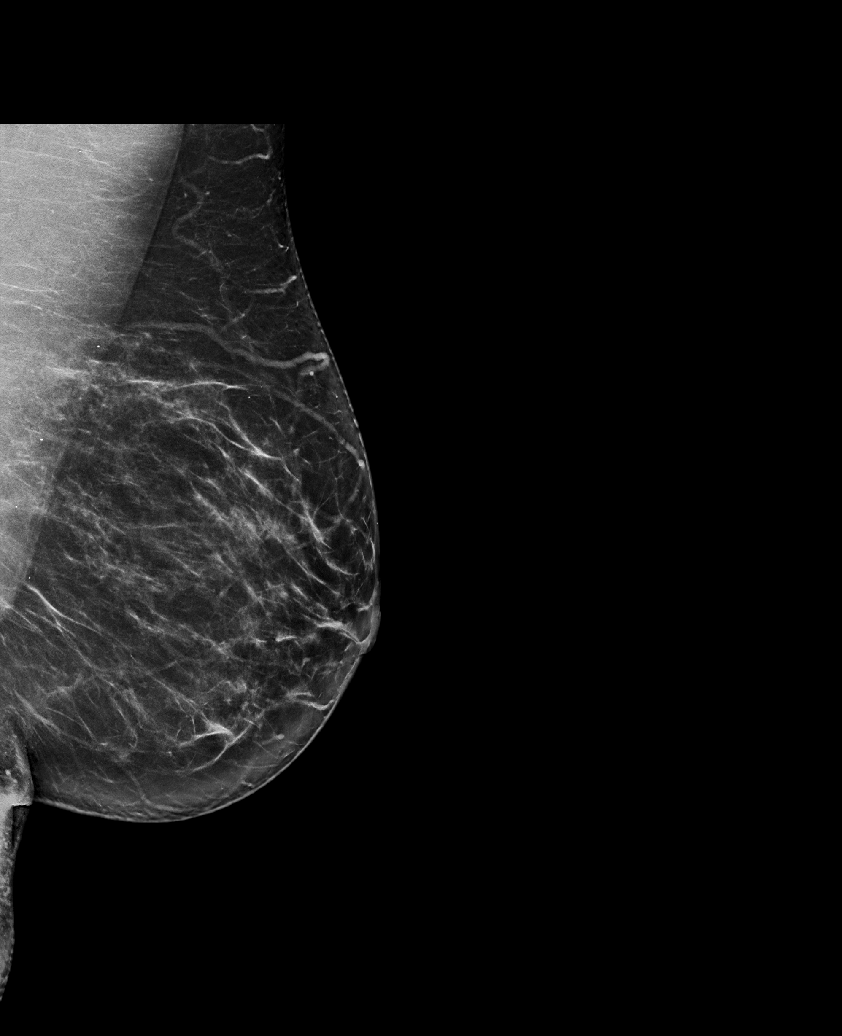

[R CC synth-2D (1 of 2)]
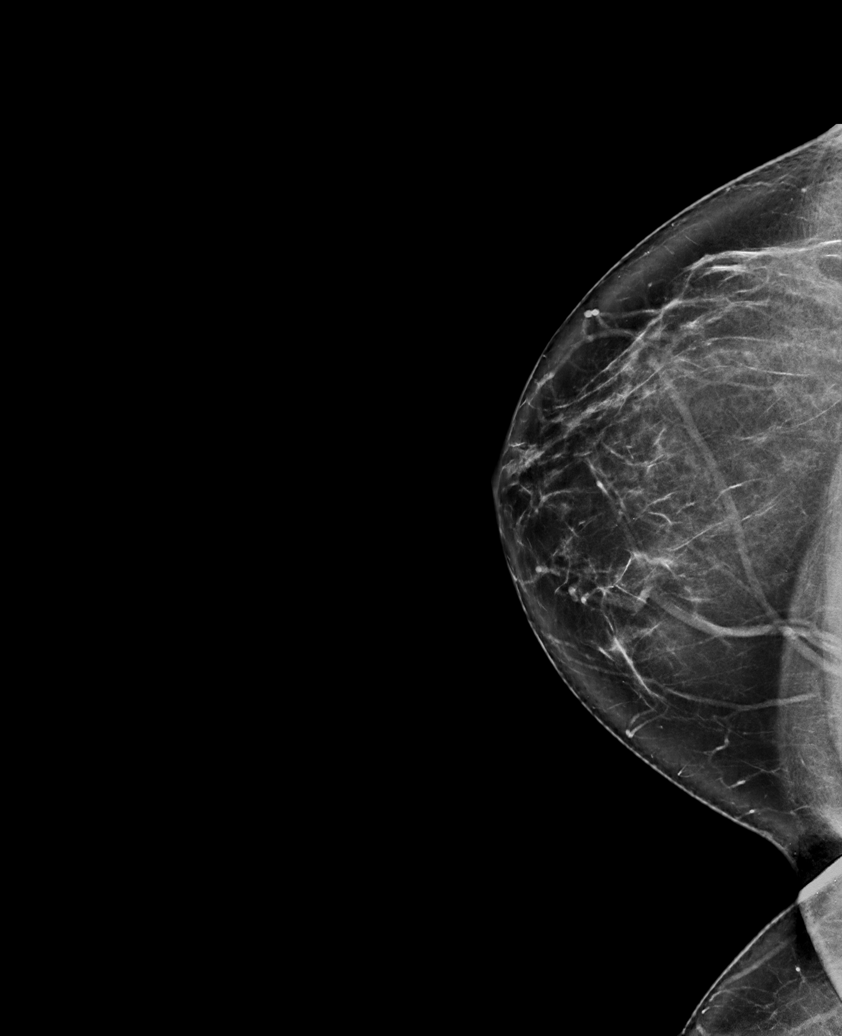

[R CC synth-2D (2 of 2)]
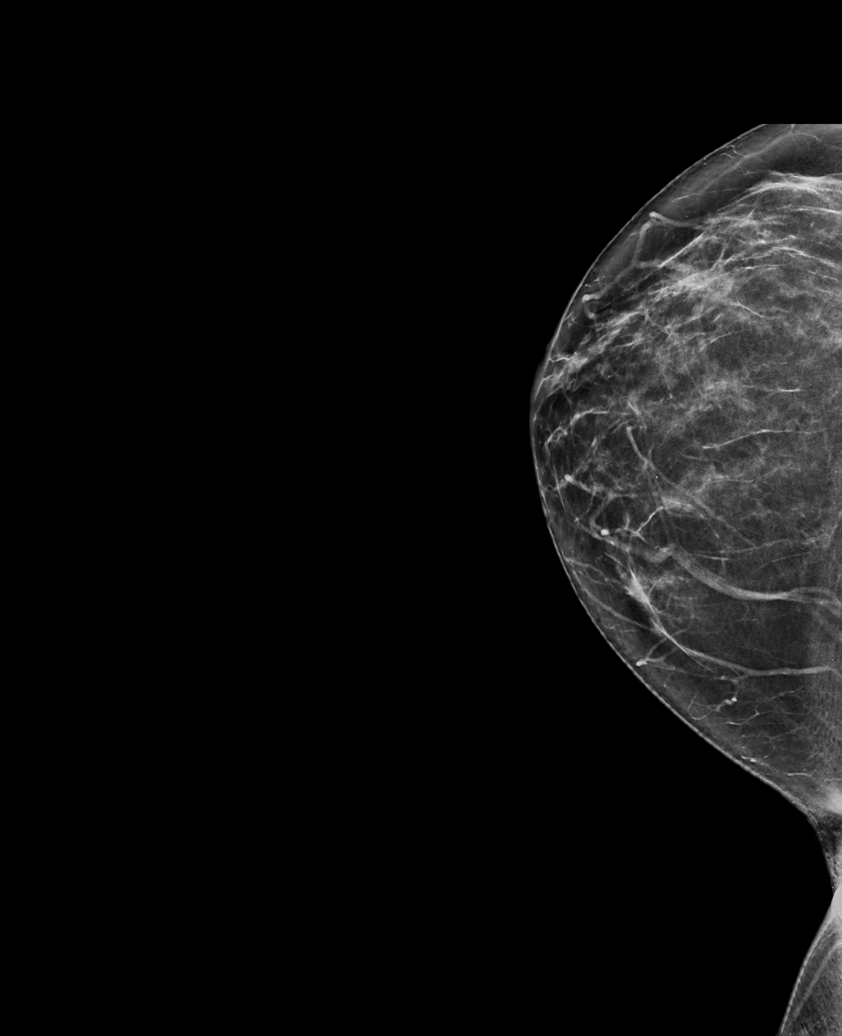

[L CC synth-2D]
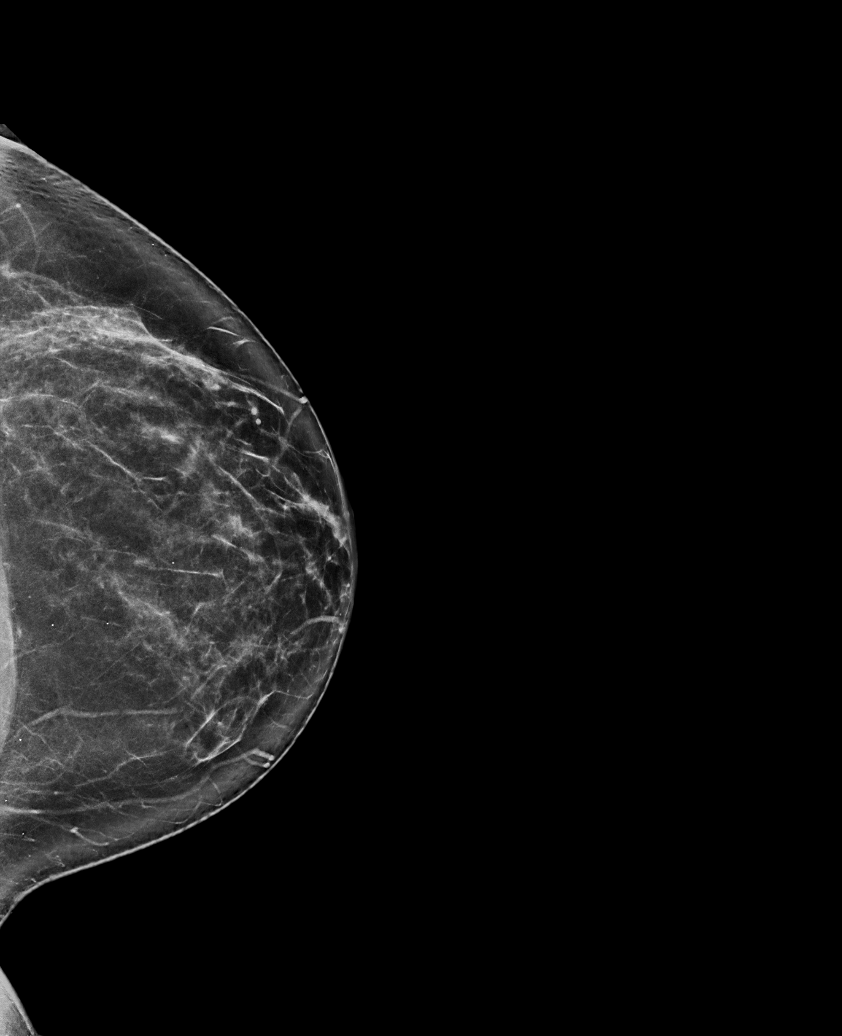

[L MLO tomo · tomo slice 43/85.0]
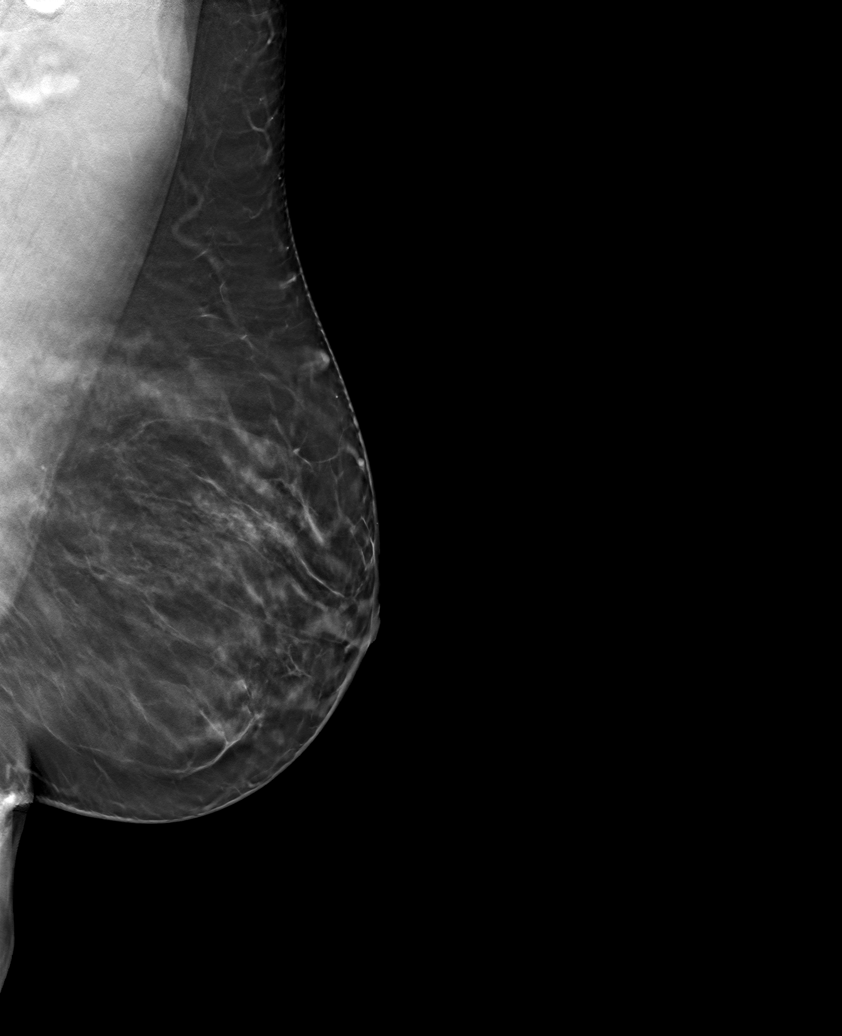

[6 of 30 positions shown; findings below may reference images not displayed]

ACR Breast Density Category b: There are scattered areas of
fibroglandular density.
FINDINGS: There are no findings suspicious for malignancy. Images were
processed with CAD.
IMPRESSION: No mammographic evidence of malignancy. A result letter of this
screening mammogram will be mailed directly to the patient.

RECOMMENDATION:
Screening mammogram in one year. (Code:CN-U-775)

BI-RADS CATEGORY  1: Negative.

## 2020-04-18 DIAGNOSIS — Z20822 Contact with and (suspected) exposure to covid-19: Secondary | ICD-10-CM | POA: Diagnosis not present

## 2020-05-01 DIAGNOSIS — Z0000 Encounter for general adult medical examination without abnormal findings: Secondary | ICD-10-CM | POA: Diagnosis not present

## 2020-05-01 DIAGNOSIS — R7989 Other specified abnormal findings of blood chemistry: Secondary | ICD-10-CM | POA: Diagnosis not present

## 2020-05-07 DIAGNOSIS — M79643 Pain in unspecified hand: Secondary | ICD-10-CM | POA: Diagnosis not present

## 2020-05-07 DIAGNOSIS — E669 Obesity, unspecified: Secondary | ICD-10-CM | POA: Diagnosis not present

## 2020-05-07 DIAGNOSIS — L659 Nonscarring hair loss, unspecified: Secondary | ICD-10-CM | POA: Diagnosis not present

## 2020-05-07 DIAGNOSIS — Z0000 Encounter for general adult medical examination without abnormal findings: Secondary | ICD-10-CM | POA: Diagnosis not present

## 2020-05-07 DIAGNOSIS — R230 Cyanosis: Secondary | ICD-10-CM | POA: Diagnosis not present

## 2020-05-08 ENCOUNTER — Other Ambulatory Visit (HOSPITAL_COMMUNITY): Payer: Self-pay | Admitting: Internal Medicine

## 2020-05-08 DIAGNOSIS — R230 Cyanosis: Secondary | ICD-10-CM

## 2020-05-09 ENCOUNTER — Other Ambulatory Visit: Payer: Self-pay

## 2020-05-09 ENCOUNTER — Ambulatory Visit (HOSPITAL_COMMUNITY)
Admission: RE | Admit: 2020-05-09 | Discharge: 2020-05-09 | Disposition: A | Discharge status: Home / Self Care | Payer: BC Managed Care – PPO | Source: Ambulatory Visit | Attending: Internal Medicine | Admitting: Internal Medicine

## 2020-05-09 DIAGNOSIS — R230 Cyanosis: Secondary | ICD-10-CM

## 2020-08-29 ENCOUNTER — Other Ambulatory Visit: Payer: Self-pay | Admitting: Obstetrics and Gynecology

## 2020-08-29 DIAGNOSIS — Z1231 Encounter for screening mammogram for malignant neoplasm of breast: Secondary | ICD-10-CM

## 2020-09-28 DIAGNOSIS — L661 Lichen planopilaris: Secondary | ICD-10-CM | POA: Diagnosis not present

## 2020-10-09 ENCOUNTER — Other Ambulatory Visit: Payer: Self-pay | Admitting: Obstetrics and Gynecology

## 2020-10-09 DIAGNOSIS — Z1231 Encounter for screening mammogram for malignant neoplasm of breast: Secondary | ICD-10-CM

## 2020-10-12 ENCOUNTER — Ambulatory Visit: Payer: BC Managed Care – PPO

## 2020-10-16 ENCOUNTER — Ambulatory Visit
Admission: RE | Admit: 2020-10-16 | Discharge: 2020-10-16 | Disposition: A | Discharge status: Home / Self Care | Payer: BC Managed Care – PPO | Source: Ambulatory Visit | Attending: Obstetrics and Gynecology | Admitting: Obstetrics and Gynecology

## 2020-10-16 ENCOUNTER — Other Ambulatory Visit: Payer: Self-pay

## 2020-10-16 DIAGNOSIS — Z1231 Encounter for screening mammogram for malignant neoplasm of breast: Secondary | ICD-10-CM | POA: Diagnosis not present

## 2020-11-19 DIAGNOSIS — R102 Pelvic and perineal pain: Secondary | ICD-10-CM | POA: Diagnosis not present

## 2020-11-19 DIAGNOSIS — Z6831 Body mass index (BMI) 31.0-31.9, adult: Secondary | ICD-10-CM | POA: Diagnosis not present

## 2020-11-19 DIAGNOSIS — Z01419 Encounter for gynecological examination (general) (routine) without abnormal findings: Secondary | ICD-10-CM | POA: Diagnosis not present

## 2020-11-27 DIAGNOSIS — H17823 Peripheral opacity of cornea, bilateral: Secondary | ICD-10-CM | POA: Diagnosis not present

## 2020-11-27 DIAGNOSIS — Z79899 Other long term (current) drug therapy: Secondary | ICD-10-CM | POA: Diagnosis not present

## 2020-12-03 DIAGNOSIS — K529 Noninfective gastroenteritis and colitis, unspecified: Secondary | ICD-10-CM | POA: Diagnosis not present

## 2020-12-18 DIAGNOSIS — Z85828 Personal history of other malignant neoplasm of skin: Secondary | ICD-10-CM | POA: Diagnosis not present

## 2020-12-18 DIAGNOSIS — L661 Lichen planopilaris: Secondary | ICD-10-CM | POA: Diagnosis not present

## 2021-01-03 DIAGNOSIS — L661 Lichen planopilaris: Secondary | ICD-10-CM | POA: Diagnosis not present

## 2021-01-03 DIAGNOSIS — L0102 Bockhart's impetigo: Secondary | ICD-10-CM | POA: Diagnosis not present

## 2021-04-18 DIAGNOSIS — D2372 Other benign neoplasm of skin of left lower limb, including hip: Secondary | ICD-10-CM | POA: Diagnosis not present

## 2021-04-18 DIAGNOSIS — L659 Nonscarring hair loss, unspecified: Secondary | ICD-10-CM | POA: Diagnosis not present

## 2021-04-18 DIAGNOSIS — Z85828 Personal history of other malignant neoplasm of skin: Secondary | ICD-10-CM | POA: Diagnosis not present

## 2021-04-18 DIAGNOSIS — L814 Other melanin hyperpigmentation: Secondary | ICD-10-CM | POA: Diagnosis not present

## 2021-07-30 DIAGNOSIS — I10 Essential (primary) hypertension: Secondary | ICD-10-CM | POA: Diagnosis not present

## 2021-08-06 DIAGNOSIS — G5603 Carpal tunnel syndrome, bilateral upper limbs: Secondary | ICD-10-CM | POA: Insufficient documentation

## 2021-08-06 DIAGNOSIS — Z1331 Encounter for screening for depression: Secondary | ICD-10-CM | POA: Diagnosis not present

## 2021-08-06 DIAGNOSIS — E282 Polycystic ovarian syndrome: Secondary | ICD-10-CM | POA: Diagnosis not present

## 2021-08-06 DIAGNOSIS — Z0000 Encounter for general adult medical examination without abnormal findings: Secondary | ICD-10-CM | POA: Diagnosis not present

## 2021-08-06 DIAGNOSIS — Z1339 Encounter for screening examination for other mental health and behavioral disorders: Secondary | ICD-10-CM | POA: Diagnosis not present

## 2021-08-06 DIAGNOSIS — Z23 Encounter for immunization: Secondary | ICD-10-CM | POA: Diagnosis not present

## 2021-08-21 DIAGNOSIS — G5603 Carpal tunnel syndrome, bilateral upper limbs: Secondary | ICD-10-CM | POA: Diagnosis not present

## 2021-08-27 DIAGNOSIS — G5602 Carpal tunnel syndrome, left upper limb: Secondary | ICD-10-CM | POA: Diagnosis not present

## 2021-09-12 ENCOUNTER — Ambulatory Visit (HOSPITAL_BASED_OUTPATIENT_CLINIC_OR_DEPARTMENT_OTHER)
Admission: RE | Admit: 2021-09-12 | Payer: BC Managed Care – PPO | Source: Home / Self Care | Admitting: Orthopedic Surgery

## 2021-09-12 SURGERY — CARPAL TUNNEL RELEASE
Anesthesia: Monitor Anesthesia Care | Laterality: Left

## 2021-09-14 DIAGNOSIS — R059 Cough, unspecified: Secondary | ICD-10-CM | POA: Diagnosis not present

## 2021-09-14 DIAGNOSIS — J029 Acute pharyngitis, unspecified: Secondary | ICD-10-CM | POA: Diagnosis not present

## 2021-09-14 DIAGNOSIS — R0981 Nasal congestion: Secondary | ICD-10-CM | POA: Diagnosis not present

## 2021-09-26 ENCOUNTER — Encounter (HOSPITAL_BASED_OUTPATIENT_CLINIC_OR_DEPARTMENT_OTHER): Payer: Self-pay | Admitting: Orthopedic Surgery

## 2021-09-26 ENCOUNTER — Other Ambulatory Visit: Payer: Self-pay

## 2021-09-26 NOTE — Progress Notes (Signed)
Spoke w/ via phone for pre-op interview: patient  Lab needs dos: EKG, BMP Lab results: NA COVID test: patient states asymptomatic no test needed. Arrive at 09:55 am. NPO after MN except clear liquids.Clear liquids from MN until 08:55 am. Med rec completed. Medications to take morning of surgery: none; Stop phentermine until post procedure. Diabetic medication: NA Patient instructed no nail polish to be worn day of surgery. Patient instructed to bring photo id and insurance card day of surgery. Patient aware to have Driver (ride ) / caregiver for 24 hours after surgery. Patient Special Instructions: NA Pre-Op special Istructions: NA Patient verbalized understanding of instructions that were given at this phone interview. Patient denies shortness of breath, chest pain, fever, cough at this phone interview.

## 2021-10-02 NOTE — Progress Notes (Signed)
Patient notified of arrival time change from 30 to 29. Clear liquids until 0530. Pt verbalized understanding of instructions.

## 2021-10-02 NOTE — H&P (Signed)
Preoperative History & Physical Exam  Surgeon: Matt Holmes, MD  Diagnosis: Carpal tunnel syndrome of left wrist  Planned Procedure: Procedure(s) (LRB): CARPAL TUNNEL RELEASE left (Left)  History of Present Illness:   Patient is a 46 y.o. female with symptoms consistent with Carpal tunnel syndrome of left wrist who presents for surgical intervention. The risks, benefits and alternatives of surgical intervention were discussed and informed consent was obtained prior to surgery.  Past Medical History:  Past Medical History:  Diagnosis Date   Elevated LFTs    GERD (gastroesophageal reflux disease)    Hypertension    Polycystic ovarian syndrome     Past Surgical History:  Past Surgical History:  Procedure Laterality Date   ABDOMINAL HYSTERECTOMY  05/2010   partial   CESAREAN SECTION     CHOLECYSTECTOMY  06/10/11    lap chole    PARTIAL HYSTERECTOMY      Medications:  Prior to Admission medications   Medication Sig Start Date End Date Taking? Authorizing Provider  AMLODIPINE-OLMESARTAN PO Take by mouth.   Yes [provider]  ESOMEPRAZOLE MAGNESIUM PO Take by mouth at bedtime.   Yes [provider]  PHENTERMINE HCL PO Take by mouth.   Yes [provider]  amitriptyline (ELAVIL) 25 MG tablet TAKE 1-3 TABLETS BY MOUTH AT BEDTIME 11/20/15   [provider]  amoxicillin-clavulanate (AUGMENTIN) 875-125 MG tablet Take 1 tablet by mouth 2 (two) times daily. 12/09/15   Leandrew Koyanagi, MD  gabapentin (NEURONTIN) 300 MG capsule Take 300 mg by mouth 3 (three) times daily.    [provider]  ibuprofen (ADVIL,MOTRIN) 200 MG tablet Take 800 mg by mouth every 6 (six) hours as needed. Reported on 12/09/2015    [provider]  ipratropium (ATROVENT) 0.06 % nasal spray Place 2 sprays into both nostrils 4 (four) times daily. Patient not taking: Reported on 12/09/2015 02/04/15   Billy Fischer, MD  lisinopril (PRINIVIL,ZESTRIL) 20 MG  tablet Take 20 mg by mouth daily. 12/05/15   [provider]  metFORMIN (GLUCOPHAGE-XR) 500 MG 24 hr tablet Take 3 tablets (1,500 mg total) by mouth daily with supper. 02/23/14   Elayne Snare, MD  promethazine (PHENERGAN) 25 MG tablet Take 1 tablet (25 mg total) by mouth every 6 (six) hours as needed for nausea or vomiting. Patient not taking: Reported on 12/09/2015 02/04/15   Billy Fischer, MD  spironolactone (ALDACTONE) 50 MG tablet Take 1 tablet (50 mg total) by mouth daily. Patient not taking: Reported on 12/09/2015 09/27/14   Elayne Snare, MD  tiZANidine (ZANAFLEX) 4 MG tablet Take 4 mg by mouth once.    [provider]  ZIPSOR 25 MG CAPS 25 mg every 6 (six) hours. Reported on 12/09/2015 04/04/14   [provider]    Allergies:  Vicodin [hydrocodone-acetaminophen]  Review of Systems: Negative except per HPI.  Physical Exam: Alert and oriented, NAD Head and neck: no masses, normal alignment CV: pulse intact Pulm: no increased work of breathing, respirations even and unlabored Abdomen: non-distended Extremities: extremities warm and well perfused  LABS: No results found for this or any previous visit (from the past 2160 hour(s)).   Complete History and Physical exam available in the office notes  [name]

## 2021-10-03 ENCOUNTER — Encounter (HOSPITAL_BASED_OUTPATIENT_CLINIC_OR_DEPARTMENT_OTHER)
Admission: RE | Disposition: A | Discharge status: Home / Self Care | Payer: Self-pay | Source: Home / Self Care | Attending: Orthopedic Surgery

## 2021-10-03 ENCOUNTER — Ambulatory Visit (HOSPITAL_BASED_OUTPATIENT_CLINIC_OR_DEPARTMENT_OTHER)
Admission: RE | Admit: 2021-10-03 | Discharge: 2021-10-03 | Disposition: A | Discharge status: Home / Self Care | Payer: BC Managed Care – PPO | Attending: Orthopedic Surgery | Admitting: Orthopedic Surgery

## 2021-10-03 ENCOUNTER — Ambulatory Visit (HOSPITAL_BASED_OUTPATIENT_CLINIC_OR_DEPARTMENT_OTHER): Payer: BC Managed Care – PPO | Admitting: Certified Registered Nurse Anesthetist

## 2021-10-03 ENCOUNTER — Other Ambulatory Visit: Payer: Self-pay

## 2021-10-03 ENCOUNTER — Encounter (HOSPITAL_BASED_OUTPATIENT_CLINIC_OR_DEPARTMENT_OTHER): Payer: Self-pay | Admitting: Orthopedic Surgery

## 2021-10-03 DIAGNOSIS — F17200 Nicotine dependence, unspecified, uncomplicated: Secondary | ICD-10-CM | POA: Diagnosis not present

## 2021-10-03 DIAGNOSIS — G5602 Carpal tunnel syndrome, left upper limb: Secondary | ICD-10-CM | POA: Insufficient documentation

## 2021-10-03 DIAGNOSIS — I10 Essential (primary) hypertension: Secondary | ICD-10-CM | POA: Insufficient documentation

## 2021-10-03 DIAGNOSIS — K219 Gastro-esophageal reflux disease without esophagitis: Secondary | ICD-10-CM | POA: Insufficient documentation

## 2021-10-03 DIAGNOSIS — E282 Polycystic ovarian syndrome: Secondary | ICD-10-CM | POA: Insufficient documentation

## 2021-10-03 HISTORY — PX: CARPAL TUNNEL RELEASE: SHX101

## 2021-10-03 HISTORY — DX: Polycystic ovarian syndrome: E28.2

## 2021-10-03 HISTORY — DX: Gastro-esophageal reflux disease without esophagitis: K21.9

## 2021-10-03 LAB — POCT I-STAT, CHEM 8
BUN: 13 mg/dL (ref 6–20)
Calcium, Ion: 1.22 mmol/L (ref 1.15–1.40)
Chloride: 106 mmol/L (ref 98–111)
Creatinine, Ser: 0.7 mg/dL (ref 0.44–1.00)
Glucose, Bld: 105 mg/dL — ABNORMAL HIGH (ref 70–99)
HCT: 39 % (ref 36.0–46.0)
Hemoglobin: 13.3 g/dL (ref 12.0–15.0)
Potassium: 3.9 mmol/L (ref 3.5–5.1)
Sodium: 138 mmol/L (ref 135–145)
TCO2: 24 mmol/L (ref 22–32)

## 2021-10-03 SURGERY — CARPAL TUNNEL RELEASE
Anesthesia: Monitor Anesthesia Care | Site: Wrist | Laterality: Left

## 2021-10-03 MED ORDER — MIDAZOLAM HCL 5 MG/5ML IJ SOLN
INTRAMUSCULAR | Status: DC | PRN
  Administered 2021-10-03: 2 mg via INTRAVENOUS

## 2021-10-03 MED ORDER — OXYCODONE-ACETAMINOPHEN 5-325 MG PO TABS: 1.0000 | ORAL_TABLET | Freq: Four times a day (QID) | ORAL | 0 refills | Status: AC | PRN

## 2021-10-03 MED ORDER — FENTANYL CITRATE (PF) 100 MCG/2ML IJ SOLN
INTRAMUSCULAR | Status: AC
  Filled 2021-10-03: qty 2

## 2021-10-03 MED ORDER — PROPOFOL 500 MG/50ML IV EMUL
INTRAVENOUS | Status: DC | PRN
  Administered 2021-10-03: 100 ug/kg/min via INTRAVENOUS

## 2021-10-03 MED ORDER — LACTATED RINGERS IV SOLN: INTRAVENOUS | Status: DC

## 2021-10-03 MED ORDER — FENTANYL CITRATE (PF) 100 MCG/2ML IJ SOLN
INTRAMUSCULAR | Status: DC | PRN
  Administered 2021-10-03: 100 ug via INTRAVENOUS

## 2021-10-03 MED ORDER — BUPIVACAINE HCL (PF) 0.5 % IJ SOLN
INTRAMUSCULAR | Status: DC | PRN
  Administered 2021-10-03: 5 mL

## 2021-10-03 MED ORDER — ONDANSETRON HCL 4 MG/2ML IJ SOLN
INTRAMUSCULAR | Status: DC | PRN
  Administered 2021-10-03: 4 mg via INTRAVENOUS

## 2021-10-03 MED ORDER — BACITRACIN ZINC 500 UNIT/GM EX OINT
TOPICAL_OINTMENT | CUTANEOUS | Status: DC | PRN
  Administered 2021-10-03: 1 via TOPICAL

## 2021-10-03 MED ORDER — FENTANYL CITRATE (PF) 100 MCG/2ML IJ SOLN: 25.0000 ug | INTRAMUSCULAR | Status: DC | PRN

## 2021-10-03 MED ORDER — MIDAZOLAM HCL 2 MG/2ML IJ SOLN
INTRAMUSCULAR | Status: AC
Start: 2021-10-03 — End: ?
  Filled 2021-10-03: qty 2

## 2021-10-03 MED ORDER — ONDANSETRON HCL 4 MG/2ML IJ SOLN: 4.0000 mg | Freq: Once | INTRAMUSCULAR | Status: DC | PRN

## 2021-10-03 MED ORDER — LIDOCAINE HCL (PF) 1 % IJ SOLN
INTRAMUSCULAR | Status: DC | PRN
  Administered 2021-10-03: 5 mL

## 2021-10-03 MED ORDER — KETOROLAC TROMETHAMINE 30 MG/ML IJ SOLN: 30.0000 mg | Freq: Once | INTRAMUSCULAR | Status: DC | PRN

## 2021-10-03 MED ORDER — ONDANSETRON HCL 4 MG/2ML IJ SOLN
INTRAMUSCULAR | Status: AC
  Filled 2021-10-03: qty 2

## 2021-10-03 MED ORDER — PROPOFOL 10 MG/ML IV BOLUS
INTRAVENOUS | Status: DC | PRN
  Administered 2021-10-03: 20 mg via INTRAVENOUS
  Administered 2021-10-03: 30 mg via INTRAVENOUS
  Administered 2021-10-03: 10 mg via INTRAVENOUS

## 2021-10-03 SURGICAL SUPPLY — 30 items
APL PRP STRL LF DISP 70% ISPRP (MISCELLANEOUS) ×1
BLADE SURG 15 STRL LF DISP TIS (BLADE) ×1 IMPLANT
BLADE SURG 15 STRL SS (BLADE) ×2
BNDG CMPR 9X4 STRL LF SNTH (GAUZE/BANDAGES/DRESSINGS) ×1
BNDG ELASTIC 4X5.8 VLCR STR LF (GAUZE/BANDAGES/DRESSINGS) ×2 IMPLANT
BNDG ESMARK 4X9 LF (GAUZE/BANDAGES/DRESSINGS) ×2 IMPLANT
CHLORAPREP W/TINT 26 (MISCELLANEOUS) ×1 IMPLANT
COVER BACK TABLE 60X90IN (DRAPES) ×2 IMPLANT
CUFF TOURN SGL QUICK 18X4 (TOURNIQUET CUFF) ×2 IMPLANT
DRAPE EXTREMITY T 121X128X90 (DISPOSABLE) ×2 IMPLANT
DRSG EMULSION OIL 3X3 NADH (GAUZE/BANDAGES/DRESSINGS) ×2 IMPLANT
GAUZE 4X4 16PLY ~~LOC~~+RFID DBL (SPONGE) ×2 IMPLANT
GAUZE SPONGE 4X4 12PLY STRL (GAUZE/BANDAGES/DRESSINGS) ×2 IMPLANT
GAUZE SPONGE 4X4 12PLY STRL LF (GAUZE/BANDAGES/DRESSINGS) ×1 IMPLANT
GLOVE SURG UNDER POLY LF SZ7.5 (GLOVE) ×2 IMPLANT
GOWN STRL REUS W/TWL LRG LVL3 (GOWN DISPOSABLE) ×2 IMPLANT
HIBICLENS CHG 4% 4OZ BTL (MISCELLANEOUS) ×3 IMPLANT
KIT TURNOVER CYSTO (KITS) ×2 IMPLANT
KNIFE CARPAL TUNNEL (BLADE) ×2 IMPLANT
NEEDLE HYPO 22GX1.5 SAFETY (NEEDLE) ×2 IMPLANT
NS IRRIG 500ML POUR BTL (IV SOLUTION) ×2 IMPLANT
PACK BASIN DAY SURGERY FS (CUSTOM PROCEDURE TRAY) ×2 IMPLANT
PAD CAST 4YDX4 CTTN HI CHSV (CAST SUPPLIES) ×1 IMPLANT
PADDING CAST COTTON 4X4 STRL (CAST SUPPLIES) ×2
SUT ETHILON 4 0 PS 2 18 (SUTURE) ×2 IMPLANT
SYR 10ML LL (SYRINGE) ×2 IMPLANT
SYR BULB EAR ULCER 3OZ GRN STR (SYRINGE) ×2 IMPLANT
TOWEL OR 17X26 10 PK STRL BLUE (TOWEL DISPOSABLE) ×2 IMPLANT
TRAY DSU PREP LF (CUSTOM PROCEDURE TRAY) ×2 IMPLANT
UNDERPAD 30X36 HEAVY ABSORB (UNDERPADS AND DIAPERS) ×2 IMPLANT

## 2021-10-03 NOTE — Op Note (Signed)
OPERATIVE NOTE  DATE OF PROCEDURE: 10/03/2021  SURGEON: Izell Corunna, MD  PREOPERATIVE DIAGNOSIS: Left Carpal Tunnel Syndrome  POSTOPERATIVE DIAGNOSIS: Same  NAME OF PROCEDURE: Left Carpal Tunnel Release  ANESTHESIA: Local + MAC  SKIN PREPARATION: Hibiclens  ESTIMATED BLOOD LOSS: Minimal  IMPLANTS: none  INDICATIONS:  Amanda Stokes is a 46 y.o. female who presents with left carpal tunnel syndrome, refractory to nonoperative treatment. The patient has decided to proceed with surgical intervention.  Risks, benefits and alternatives of operative management were discussed including, but not limited to, risks of anesthesia complications, infection, pain, persistent symptoms, stiffness, need for future surgery.  The patient understands, agrees and elects to proceed with surgery.    DESCRIPTION OF PROCEDURE: The patient was placed in the usual supine position and the left upper extremity was prepped and draped in normal sterile fashion.  After local block anesthetic to the left hand and wrist, a standard 1.5 cm incision was made in the midpalm.  This was carried down through the subcutaneous tissues and palmar fascia to the transverse carpal ligament.  The distal one-half of the transverse carpal ligament was incised longitudinally under direct vision using a 15 blade.  The carpal tunnel release guide was then placed under direct vision on the transverse carpal ligament and slid proximally.  The guide was palpated into appropriate alignment longitudinally.  Contact with the transverse carpal ligament was maintained throughout passing.  The blade was engaged into the guide and the remaining portion of the transverse carpal ligament released completely.  No other abnormalities were noted.  The wound was copiously irrigated and the skin closed using horizontal mattress 4-0 nylon sutures.  A light bulky dressing was placed.  The patient tolerated the procedure well and returned to the recovery room in stable  condition.  I was present for the entire surgical procedure.   Amanda Holmes, MD

## 2021-10-03 NOTE — Anesthesia Preprocedure Evaluation (Signed)
Anesthesia Evaluation  Patient identified by MRN, date of birth, ID band Patient awake    Reviewed: Allergy & Precautions, NPO status , Patient's Chart, lab work & pertinent test results  Airway Mallampati: II  TM Distance: >3 FB Neck ROM: Full    Dental no notable dental hx.    Pulmonary neg pulmonary ROS, Current Smoker and Patient abstained from smoking.,    Pulmonary exam normal breath sounds clear to auscultation       Cardiovascular hypertension, Pt. on medications Normal cardiovascular exam Rhythm:Regular Rate:Normal     Neuro/Psych negative neurological ROS  negative psych ROS   GI/Hepatic Neg liver ROS, GERD  Medicated,  Endo/Other  PCOS  Renal/GU negative Renal ROS  negative genitourinary   Musculoskeletal negative musculoskeletal ROS (+)   Abdominal   Peds negative pediatric ROS (+)  Hematology negative hematology ROS (+)   Anesthesia Other Findings   Reproductive/Obstetrics negative OB ROS                             Anesthesia Physical Anesthesia Plan  ASA: 2  Anesthesia Plan: MAC   Post-op Pain Management: Minimal or no pain anticipated   Induction: Intravenous  PONV Risk Score and Plan: 2 and Ondansetron, Propofol infusion, Midazolam and Treatment may vary due to age or medical condition  Airway Management Planned: Simple Face Mask  Additional Equipment:   Intra-op Plan:   Post-operative Plan:   Informed Consent: I have reviewed the patients History and Physical, chart, labs and discussed the procedure including the risks, benefits and alternatives for the proposed anesthesia with the patient or authorized representative who has indicated his/her understanding and acceptance.     Dental advisory given  Plan Discussed with: CRNA and Surgeon  Anesthesia Plan Comments:         Anesthesia Quick Evaluation

## 2021-10-03 NOTE — Discharge Instructions (Addendum)
Orthopaedic Hand Surgery Discharge Instructions  WEIGHT BEARING STATUS: Non weight bearing on operative extremity  INCISION CARE: Keep dressing over your incision clean and dry until 5 days after surgery. You may shower by placing a waterproof covering over your dressing. Once dressing is removed, you may allow water to run over the incision and then place Band-Aids over incision. Do not scrub your incision or apply creams/lotions. Do not submerge your incision or swim for 3 weeks after surgery. Contact your surgeon or primary care doctor if you develop redness or drainage from your incision.   PAIN CONTROL: First line medications for post operative pain control are Tylenol (acetaminophen) and Motrin (ibuprofen) if you are able to take these medications. If you have been prescribed a medication these can be taken as breakthrough pain medications. Please note that some narcotic pain medication has acetaminophen added and you should never consume more than 4,000mg of acetaminophen in 24-hour period. Please note that if you are given Toradol (ketorolac) you should not take similar medications such as ibuprofen or naproxen.  DISCHARGE MEDICATIONS: If you have been prescribed medication it was sent electronically to your pharmacy. No changes have been made to your home medications.  ICE/ELEVATION: Ice and elevate your injured extremity as needed. Avoid direct contact of ice with skin.   BANDAGE FEELS TOO TIGHT: If your bandage feels too tight, first make sure you are elevating your fingers as much as possible. The outer layer of the bandage can be unwrapped and reapplied more loosely. If no improvement, you may carefully cut the inner layer longitudinally until the pressure has resolved and then rewrap the outer layer. If you are not comfortable with these instructions, please call the office and the bandage can be changed for you.   FOLLOW UP: You will be called after surgery with an appointment date and  time, however if you have not received a phone call within 3 days, please call during regular office hours at [phone] to schedule a post operative appointment.  Please Seek Medical Attention if: Call MD for: pain or pressure in chest, jaw, arm, back, neck  Call MD for: temperature greater than 101 F for more than 24 hrs Call MD for: difficulty breathing Call MD for: incision redness, bleeding, drainage  Call MD for: palpitations or feeling that the heart is racing  Call MD for: increased swelling in arm, leg, ankle, or abdomen  Call MD for: lightheadedness, dizziness, fainting Call 911 or go to ER for any medical emergency if you are not able to get in touch with your doctor   J. Reid Spears, MD Orthopaedic Hand Surgeon [facility] Office number: [phone] [address] [place], [place] [address]   Post Anesthesia Home Care Instructions  Activity: Get plenty of rest for the remainder of the day. A responsible individual must stay with you for 24 hours following the procedure.  For the next 24 hours, DO NOT: -Drive a car -Operate machinery -Drink alcoholic beverages -Take any medication unless instructed by your physician -Make any legal decisions or sign important papers.  Meals: Start with liquid foods such as gelatin or soup. Progress to regular foods as tolerated. Avoid greasy, spicy, heavy foods. If nausea and/or vomiting occur, drink only clear liquids until the nausea and/or vomiting subsides. Call your physician if vomiting continues.  Special Instructions/Symptoms: Your throat may feel dry or sore from the anesthesia or the breathing tube placed in your throat during surgery. If this causes discomfort, gargle with warm   salt water. The discomfort should disappear within 24 hours.      

## 2021-10-03 NOTE — Transfer of Care (Signed)
Immediate Anesthesia Transfer of Care Note  Patient: Amanda Stokes  Procedure(s) Performed: CARPAL TUNNEL RELEASE left (Left: Wrist)  Patient Location: PACU  Anesthesia Type:MAC  Level of Consciousness: awake, alert , oriented and patient cooperative  Airway & Oxygen Therapy: Patient Spontanous Breathing  Post-op Assessment: Report given to RN and Post -op Vital signs reviewed and stable  Post vital signs: Reviewed and stable  Last Vitals:  Vitals Value Taken Time  BP 107/66 10/03/21 0835  Temp    Pulse 78 10/03/21 0836  Resp 15 10/03/21 0836  SpO2 97 % 10/03/21 0836  Vitals shown include unvalidated device data.  Last Pain:  Vitals:   10/03/21 0704  TempSrc: Oral  PainSc: 0-No pain      Patients Stated Pain Goal: 6 (97/91/50 4136)  Complications: No notable events documented.

## 2021-10-03 NOTE — Interval H&P Note (Signed)
History and Physical Interval Note:  10/03/2021 7:56 AM  Amanda Stokes All  has presented today for surgery, with the diagnosis of Carpal tunnel syndrome of left wrist.  The various methods of treatment have been discussed with the patient and family. After consideration of risks, benefits and other options for treatment, the patient has consented to  Procedure(s) with comments: CARPAL TUNNEL RELEASE left (Left) - with local anesthesia as a surgical intervention.  The patient's history has been reviewed, patient examined, no change in status, stable for surgery.  I have reviewed the patient's chart and labs.  Questions were answered to the patient's satisfaction.     [name]

## 2021-10-03 NOTE — Anesthesia Postprocedure Evaluation (Signed)
Anesthesia Post Note  Patient: Amanda Stokes  Procedure(s) Performed: CARPAL TUNNEL RELEASE left (Left: Wrist)     Patient location during evaluation: PACU Anesthesia Type: MAC Level of consciousness: awake and alert Pain management: pain level controlled Vital Signs Assessment: post-procedure vital signs reviewed and stable Respiratory status: spontaneous breathing, nonlabored ventilation, respiratory function stable and patient connected to nasal cannula oxygen Cardiovascular status: stable and blood pressure returned to baseline Postop Assessment: no apparent nausea or vomiting Anesthetic complications: no   No notable events documented.  Last Vitals:  Vitals:   10/03/21 0845 10/03/21 0855  BP: 102/72 111/75  Pulse: 72 72  Resp: 13 14  Temp:  36.6 C  SpO2: 96% 97%    Last Pain:  Vitals:   10/03/21 0855  TempSrc:   PainSc: 0-No pain                 Shyra Emile S

## 2021-10-04 ENCOUNTER — Encounter (HOSPITAL_BASED_OUTPATIENT_CLINIC_OR_DEPARTMENT_OTHER): Payer: Self-pay | Admitting: Orthopedic Surgery

## 2021-10-04 MED ORDER — 0.9 % SODIUM CHLORIDE (POUR BTL) OPTIME
TOPICAL | Status: DC | PRN
  Administered 2021-10-03: 500 mL

## 2021-10-17 DIAGNOSIS — Z85828 Personal history of other malignant neoplasm of skin: Secondary | ICD-10-CM | POA: Diagnosis not present

## 2021-10-17 DIAGNOSIS — L570 Actinic keratosis: Secondary | ICD-10-CM | POA: Diagnosis not present

## 2021-10-17 DIAGNOSIS — L668 Other cicatricial alopecia: Secondary | ICD-10-CM | POA: Diagnosis not present

## 2021-10-28 DIAGNOSIS — M79642 Pain in left hand: Secondary | ICD-10-CM | POA: Diagnosis not present

## 2021-11-08 DIAGNOSIS — H40013 Open angle with borderline findings, low risk, bilateral: Secondary | ICD-10-CM | POA: Diagnosis not present

## 2021-11-08 DIAGNOSIS — Z79899 Other long term (current) drug therapy: Secondary | ICD-10-CM | POA: Diagnosis not present

## 2021-11-08 DIAGNOSIS — H2513 Age-related nuclear cataract, bilateral: Secondary | ICD-10-CM | POA: Diagnosis not present

## 2021-12-09 ENCOUNTER — Other Ambulatory Visit: Payer: Self-pay | Admitting: Obstetrics and Gynecology

## 2021-12-09 DIAGNOSIS — Z1231 Encounter for screening mammogram for malignant neoplasm of breast: Secondary | ICD-10-CM

## 2021-12-17 DIAGNOSIS — Z01419 Encounter for gynecological examination (general) (routine) without abnormal findings: Secondary | ICD-10-CM | POA: Diagnosis not present

## 2021-12-17 DIAGNOSIS — Z6831 Body mass index (BMI) 31.0-31.9, adult: Secondary | ICD-10-CM | POA: Diagnosis not present

## 2021-12-17 DIAGNOSIS — Z1272 Encounter for screening for malignant neoplasm of vagina: Secondary | ICD-10-CM | POA: Diagnosis not present

## 2021-12-18 ENCOUNTER — Ambulatory Visit
Admission: RE | Admit: 2021-12-18 | Discharge: 2021-12-18 | Disposition: A | Discharge status: Home / Self Care | Payer: BC Managed Care – PPO | Source: Ambulatory Visit | Attending: Obstetrics and Gynecology | Admitting: Obstetrics and Gynecology

## 2021-12-18 DIAGNOSIS — Z1231 Encounter for screening mammogram for malignant neoplasm of breast: Secondary | ICD-10-CM

## 2021-12-22 DIAGNOSIS — R059 Cough, unspecified: Secondary | ICD-10-CM | POA: Diagnosis not present

## 2021-12-22 DIAGNOSIS — R0981 Nasal congestion: Secondary | ICD-10-CM | POA: Diagnosis not present

## 2021-12-22 DIAGNOSIS — F17200 Nicotine dependence, unspecified, uncomplicated: Secondary | ICD-10-CM | POA: Diagnosis not present

## 2021-12-22 DIAGNOSIS — J324 Chronic pansinusitis: Secondary | ICD-10-CM | POA: Diagnosis not present

## 2021-12-30 DIAGNOSIS — R051 Acute cough: Secondary | ICD-10-CM | POA: Diagnosis not present

## 2021-12-30 DIAGNOSIS — J029 Acute pharyngitis, unspecified: Secondary | ICD-10-CM | POA: Diagnosis not present

## 2021-12-30 DIAGNOSIS — R0981 Nasal congestion: Secondary | ICD-10-CM | POA: Diagnosis not present

## 2021-12-30 DIAGNOSIS — J069 Acute upper respiratory infection, unspecified: Secondary | ICD-10-CM | POA: Diagnosis not present

## 2022-01-10 DIAGNOSIS — G5601 Carpal tunnel syndrome, right upper limb: Secondary | ICD-10-CM | POA: Diagnosis not present

## 2022-01-23 DIAGNOSIS — G5601 Carpal tunnel syndrome, right upper limb: Secondary | ICD-10-CM | POA: Diagnosis not present

## 2022-01-27 DIAGNOSIS — S60221A Contusion of right hand, initial encounter: Secondary | ICD-10-CM | POA: Diagnosis not present

## 2022-01-27 DIAGNOSIS — T8130XA Disruption of wound, unspecified, initial encounter: Secondary | ICD-10-CM | POA: Diagnosis not present

## 2022-02-06 DIAGNOSIS — M5431 Sciatica, right side: Secondary | ICD-10-CM | POA: Diagnosis not present

## 2022-02-06 DIAGNOSIS — M9903 Segmental and somatic dysfunction of lumbar region: Secondary | ICD-10-CM | POA: Diagnosis not present

## 2022-02-10 DIAGNOSIS — M9903 Segmental and somatic dysfunction of lumbar region: Secondary | ICD-10-CM | POA: Diagnosis not present

## 2022-02-10 DIAGNOSIS — M5431 Sciatica, right side: Secondary | ICD-10-CM | POA: Diagnosis not present

## 2022-02-13 DIAGNOSIS — M5431 Sciatica, right side: Secondary | ICD-10-CM | POA: Diagnosis not present

## 2022-02-13 DIAGNOSIS — M9903 Segmental and somatic dysfunction of lumbar region: Secondary | ICD-10-CM | POA: Diagnosis not present

## 2022-04-03 DIAGNOSIS — L814 Other melanin hyperpigmentation: Secondary | ICD-10-CM | POA: Diagnosis not present

## 2022-04-03 DIAGNOSIS — D2362 Other benign neoplasm of skin of left upper limb, including shoulder: Secondary | ICD-10-CM | POA: Diagnosis not present

## 2022-04-03 DIAGNOSIS — Z85828 Personal history of other malignant neoplasm of skin: Secondary | ICD-10-CM | POA: Diagnosis not present

## 2022-04-03 DIAGNOSIS — C4441 Basal cell carcinoma of skin of scalp and neck: Secondary | ICD-10-CM | POA: Diagnosis not present

## 2022-04-03 DIAGNOSIS — L570 Actinic keratosis: Secondary | ICD-10-CM | POA: Diagnosis not present

## 2022-06-24 DIAGNOSIS — C4441 Basal cell carcinoma of skin of scalp and neck: Secondary | ICD-10-CM | POA: Diagnosis not present

## 2022-06-24 DIAGNOSIS — L659 Nonscarring hair loss, unspecified: Secondary | ICD-10-CM | POA: Diagnosis not present

## 2022-07-09 DIAGNOSIS — S3141XA Laceration without foreign body of vagina and vulva, initial encounter: Secondary | ICD-10-CM | POA: Diagnosis not present

## 2022-08-13 DIAGNOSIS — M791 Myalgia, unspecified site: Secondary | ICD-10-CM | POA: Diagnosis not present

## 2022-08-25 DIAGNOSIS — C4441 Basal cell carcinoma of skin of scalp and neck: Secondary | ICD-10-CM | POA: Diagnosis not present

## 2022-08-25 DIAGNOSIS — L659 Nonscarring hair loss, unspecified: Secondary | ICD-10-CM | POA: Diagnosis not present

## 2022-09-01 DIAGNOSIS — I10 Essential (primary) hypertension: Secondary | ICD-10-CM | POA: Diagnosis not present

## 2022-09-03 DIAGNOSIS — Z0000 Encounter for general adult medical examination without abnormal findings: Secondary | ICD-10-CM | POA: Diagnosis not present

## 2022-09-03 DIAGNOSIS — Z1331 Encounter for screening for depression: Secondary | ICD-10-CM | POA: Diagnosis not present

## 2022-09-03 DIAGNOSIS — R82998 Other abnormal findings in urine: Secondary | ICD-10-CM | POA: Diagnosis not present

## 2022-09-03 DIAGNOSIS — Z23 Encounter for immunization: Secondary | ICD-10-CM | POA: Diagnosis not present

## 2022-09-03 DIAGNOSIS — Z1339 Encounter for screening examination for other mental health and behavioral disorders: Secondary | ICD-10-CM | POA: Diagnosis not present

## 2022-09-17 ENCOUNTER — Ambulatory Visit (AMBULATORY_SURGERY_CENTER): Payer: BC Managed Care – PPO | Admitting: *Deleted

## 2022-09-17 VITALS — Ht 66.0 in | Wt 185.0 lb

## 2022-09-17 DIAGNOSIS — Z1211 Encounter for screening for malignant neoplasm of colon: Secondary | ICD-10-CM

## 2022-09-17 DIAGNOSIS — C4441 Basal cell carcinoma of skin of scalp and neck: Secondary | ICD-10-CM | POA: Insufficient documentation

## 2022-09-17 MED ORDER — NA SULFATE-K SULFATE-MG SULF 17.5-3.13-1.6 GM/177ML PO SOLN: 1.0000 | Freq: Once | ORAL | 0 refills | Status: AC

## 2022-09-17 NOTE — Progress Notes (Signed)
No egg or soy allergy known to patient  No issues known to pt with past sedation with any surgeries or procedures Patient denies ever being told they had issues or difficulty with intubation  No FH of Malignant Hyperthermia Pt is  on diet pills Pt is not on  home 02  Pt is not on blood thinners  Pt denies issues with constipation  Pt is not on dialysis Pt denies any upcoming cardiac testing Pt encouraged to use to use Singlecare or Goodrx to reduce cost  Patient's chart reviewed by Osvaldo Angst CNRA prior to previsit and patient appropriate for the [facility].  Previsit completed and red dot placed by patient's name on their procedure day (on provider's schedule).  Visit done over phone Instructions reviewed with pt and pt states understanding. Instructed to review once receive in mail and in my chart Instructions sent by mail with coupon and by my chart by

## 2022-10-01 ENCOUNTER — Encounter: Payer: Self-pay | Admitting: Gastroenterology

## 2022-10-14 ENCOUNTER — Encounter: Payer: Self-pay | Admitting: Gastroenterology

## 2022-10-14 ENCOUNTER — Ambulatory Visit (AMBULATORY_SURGERY_CENTER): Payer: BC Managed Care – PPO | Admitting: Gastroenterology

## 2022-10-14 VITALS — BP 124/79 | HR 68 | Temp 97.8°F | Resp 16 | Ht 66.0 in | Wt 185.0 lb

## 2022-10-14 DIAGNOSIS — Z1211 Encounter for screening for malignant neoplasm of colon: Secondary | ICD-10-CM | POA: Diagnosis not present

## 2022-10-14 NOTE — Progress Notes (Signed)
Pt's states no medical or surgical changes since previsit or office visit. 

## 2022-10-14 NOTE — Op Note (Signed)
[facility] Patient Name: Amanda Stokes Procedure Date: 10/14/2022 10:23 AM MRN: [id number] Endoscopist: Justice Britain , MD, [id number] Age: 47 Referring MD:  Date of Birth: 06/27/1976 Gender: Female Account #: [id number] Procedure:                Colonoscopy Indications:              Screening for colorectal malignant neoplasm, This                            is the patient's first colonoscopy Medicines:                Monitored Anesthesia Care Procedure:                Pre-Anesthesia Assessment:                           - Prior to the procedure, a History and Physical                            was performed, and patient medications and                            allergies were reviewed. The patient's tolerance of                            previous anesthesia was also reviewed. The risks                            and benefits of the procedure and the sedation                            options and risks were discussed with the patient.                            All questions were answered, and informed consent                            was obtained. Prior Anticoagulants: The patient has                            taken no anticoagulant or antiplatelet agents. ASA                            Grade Assessment: II - A patient with mild systemic                            disease. After reviewing the risks and benefits,                            the patient was deemed in satisfactory condition to                            undergo the procedure.  After obtaining informed consent, the colonoscope                            was passed under direct vision. Throughout the                            procedure, the patient's blood pressure, pulse, and                            oxygen saturations were monitored continuously. The                            Olympus CF-HQ190L ([id number]) Colonoscope was                            introduced through the  anus and advanced to the 3                            cm into the ileum. The colonoscopy was performed                            without difficulty. The patient tolerated the                            procedure. The quality of the bowel preparation was                            good. The terminal ileum, ileocecal valve,                            appendiceal orifice, and rectum were photographed. Scope In: 10:32:16 AM Scope Out: 10:42:36 AM Scope Withdrawal Time: 0 hours 7 minutes 46 seconds  Total Procedure Duration: 0 hours 10 minutes 20 seconds  Findings:                 Skin tags were found on perianal exam.                           The digital rectal exam findings include                            hemorrhoids. Pertinent negatives include no                            palpable rectal lesions.                           The terminal ileum and ileocecal valve appeared                            normal.                           A few small-mouthed diverticula were found in the  sigmoid colon and descending colon.                           Normal mucosa was found in the entire colon                            otherwise.                           Non-bleeding non-thrombosed internal and external                            hemorrhoids were found during retroflexion, during                            perianal exam and during digital exam. The                            hemorrhoids were Grade II (internal hemorrhoids                            that prolapse but reduce spontaneously). Complications:            No immediate complications. Estimated Blood Loss:     Estimated blood loss: none. Impression:               - Hemorrhoids and external skin tags found on                            digital rectal exam.                           - The examined portion of the ileum was normal.                           - Diverticulosis in the sigmoid colon and in the                             descending colon.                           - Normal mucosa in the entire examined colon                            otherwise.                           - Non-bleeding non-thrombosed internal and external                            hemorrhoids. Recommendation:           - The patient will be observed post-procedure,                            until all discharge criteria are met.                           -  Discharge patient to home.                           - Patient has a contact number available for                            emergencies. The signs and symptoms of potential                            delayed complications were discussed with the                            patient. Return to normal activities tomorrow.                            Written discharge instructions were provided to the                            patient.                           - High fiber diet.                           - Use FiberCon 1-2 tablets PO daily.                           - Continue present medications.                           - Repeat colonoscopy in 10 years for screening                            purposes.                           - The findings and recommendations were discussed                            with the patient.                           - The findings and recommendations were discussed                            with the designated responsible adult. Justice Britain, MD 10/14/2022 10:47:02 AM

## 2022-10-14 NOTE — Progress Notes (Signed)
Vss nad trans to pacu °

## 2022-10-14 NOTE — Patient Instructions (Signed)
Handouts provided on diverticulosis, hemorrhoids and high fiber diet.   Recommend a high-fiber diet (see handout). Use FiberCon 1-2 tablets by mouth daily.  Continue present medications.  Repeat colonoscopy in 10 years for screening purposes.   YOU HAD AN ENDOSCOPIC PROCEDURE TODAY AT [facility]:   Refer to the procedure report that was given to you for any specific questions about what was found during the examination.  If the procedure report does not answer your questions, please call your gastroenterologist to clarify.  If you requested that your care partner not be given the details of your procedure findings, then the procedure report has been included in a sealed envelope for you to review at your convenience later.  YOU SHOULD EXPECT: Some feelings of bloating in the abdomen. Passage of more gas than usual.  Walking can help get rid of the air that was put into your GI tract during the procedure and reduce the bloating. If you had a lower endoscopy (such as a colonoscopy or flexible sigmoidoscopy) you may notice spotting of blood in your stool or on the toilet paper. If you underwent a bowel prep for your procedure, you may not have a normal bowel movement for a few days.  Please Note:  You might notice some irritation and congestion in your nose or some drainage.  This is from the oxygen used during your procedure.  There is no need for concern and it should clear up in a day or so.  SYMPTOMS TO REPORT IMMEDIATELY:  Following lower endoscopy (colonoscopy or flexible sigmoidoscopy):  Excessive amounts of blood in the stool  Significant tenderness or worsening of abdominal pains  Swelling of the abdomen that is new, acute  Fever of 100F or higher  For urgent or emergent issues, a gastroenterologist can be reached at any hour by calling [phone]. Do not use MyChart messaging for urgent concerns.    DIET:  We do recommend a small meal at first, but then you may  proceed to your regular diet.  Drink plenty of fluids but you should avoid alcoholic beverages for 24 hours.  ACTIVITY:  You should plan to take it easy for the rest of today and you should NOT DRIVE or use heavy machinery until tomorrow (because of the sedation medicines used during the test).    FOLLOW UP: Our staff will call the number listed on your records the next business day following your procedure.  We will call around 7:15- 8:00 am to check on you and address any questions or concerns that you may have regarding the information given to you following your procedure. If we do not reach you, we will leave a message.     If any biopsies were taken you will be contacted by phone or by letter within the next 1-3 weeks.  Please call us at [phone] if you have not heard about the biopsies in 3 weeks.    SIGNATURES/CONFIDENTIALITY: You and/or your care partner have signed paperwork which will be entered into your electronic medical record.  These signatures attest to the fact that that the information above on your After Visit Summary has been reviewed and is understood.  Full responsibility of the confidentiality of this discharge information lies with you and/or your care-partner.

## 2022-10-14 NOTE — Progress Notes (Signed)
GASTROENTEROLOGY PROCEDURE H&P NOTE   Primary Care Physician: Velna Hatchet, MD  HPI: Amanda Stokes is a 47 y.o. female who presents for Colonoscopy for screening.  Past Medical History:  Diagnosis Date   Elevated LFTs    GERD (gastroesophageal reflux disease)    Hypertension    Polycystic ovarian syndrome    Past Surgical History:  Procedure Laterality Date   ABDOMINAL HYSTERECTOMY  05/2010   partial   CARPAL TUNNEL RELEASE Left 10/03/2021   Procedure: CARPAL TUNNEL RELEASE left;  Surgeon: [name], MD;  Location: [facility];  Service: Orthopedics;  Laterality: Left;  with local anesthesia   CESAREAN SECTION     CHOLECYSTECTOMY  06/10/11    lap chole    PARTIAL HYSTERECTOMY     Current Outpatient Medications  Medication Sig Dispense Refill   AMLODIPINE-OLMESARTAN PO Take by mouth.     diphenhydramine-acetaminophen (TYLENOL PM) 25-500 MG TABS tablet Take 1 tablet by mouth at bedtime as needed. '500mg'$      ESOMEPRAZOLE MAGNESIUM PO Take by mouth at bedtime.     PHENTERMINE HCL PO Take by mouth.     No current facility-administered medications for this visit.    Current Outpatient Medications:    AMLODIPINE-OLMESARTAN PO, Take by mouth., Disp: , Rfl:    diphenhydramine-acetaminophen (TYLENOL PM) 25-500 MG TABS tablet, Take 1 tablet by mouth at bedtime as needed. '500mg'$ , Disp: , Rfl:    ESOMEPRAZOLE MAGNESIUM PO, Take by mouth at bedtime., Disp: , Rfl:    PHENTERMINE HCL PO, Take by mouth., Disp: , Rfl:  Allergies  Allergen Reactions   Hydrocodone-Acetaminophen Itching   Varenicline Nausea And Vomiting   Vicodin [Hydrocodone-Acetaminophen] Itching   Family History  Problem Relation Age of Onset   Heart disease Father    Diabetes Maternal Grandmother        also Maternal Grandfather   Heart disease Maternal Grandfather        and Father   Diabetes Maternal Grandfather    Hypertension Maternal Grandfather    Stroke Maternal Grandfather     Kidney disease Paternal Grandmother    Thyroid disease Neg Hx    Colon cancer Neg Hx    Colon polyps Neg Hx    Esophageal cancer Neg Hx    Rectal cancer Neg Hx    Stomach cancer Neg Hx    Social History   Socioeconomic History   Marital status: Divorced    Spouse name: Not on file   Number of children: Not on file   Years of education: Not on file   Highest education level: Not on file  Occupational History   Occupation: Film/video editor    Employer: [employer]  Tobacco Use   Smoking status: Former    Packs/day: 1.00    Types: Cigarettes    Quit date: 06/01/2009    Years since quitting: 13.3   Smokeless tobacco: Never  Substance and Sexual Activity   Alcohol use: No   Drug use: No   Sexual activity: Not on file  Other Topics Concern   Not on file  Social History Narrative   Not on file   Social Determinants of Health   Financial Resource Strain: Not on file  Food Insecurity: Not on file  Transportation Needs: Not on file  Physical Activity: Not on file  Stress: Not on file  Social Connections: Not on file  Intimate Partner Violence: Not on file    Physical Exam: Today's Vitals  10/14/22 0945 10/14/22 0948  BP: 116/63   Pulse: 91   Temp: 97.8 F (36.6 C) 97.8 F (36.6 C)  TempSrc: Skin   SpO2: 100%   Weight: 185 lb (83.9 kg)   Height: '5\' 6"'$  (1.676 m)    Body mass index is 29.86 kg/m. GEN: NAD EYE: Sclerae anicteric ENT: MMM CV: Non-tachycardic GI: Soft, NT/ND NEURO:  Alert & Oriented x 3  Lab Results: No results for input(s): "WBC", "HGB", "HCT", "PLT" in the last 72 hours. BMET No results for input(s): "NA", "K", "CL", "CO2", "GLUCOSE", "BUN", "CREATININE", "CALCIUM" in the last 72 hours. LFT No results for input(s): "PROT", "ALBUMIN", "AST", "ALT", "ALKPHOS", "BILITOT", "BILIDIR", "IBILI" in the last 72 hours. PT/INR No results for input(s): "LABPROT", "INR" in the last 72 hours.   Impression / Plan: This is a 47  y.o.female who presents for Colonoscopy for screening.  The risks and benefits of endoscopic evaluation/treatment were discussed with the patient and/or family; these include but are not limited to the risk of perforation, infection, bleeding, missed lesions, lack of diagnosis, severe illness requiring hospitalization, as well as anesthesia and sedation related illnesses.  The patient's history has been reviewed, patient examined, no change in status, and deemed stable for procedure.  The patient and/or family is agreeable to proceed.    Justice Britain, MD [facility] Office # [id number]

## 2022-10-15 ENCOUNTER — Telehealth: Payer: Self-pay

## 2022-10-15 NOTE — Telephone Encounter (Signed)
  Follow up Call-     10/14/2022    9:48 AM  Call back number  Post procedure Call Back phone  # [phone]  Permission to leave phone message Yes     Patient questions:  Do you have a fever, pain , or abdominal swelling? No. Pain Score  0 *  Have you tolerated food without any problems? Yes.    Have you been able to return to your normal activities? Yes.    Do you have any questions about your discharge instructions: Diet   No. Medications  No. Follow up visit  No.  Do you have questions or concerns about your Care? No.  Actions: * If pain score is 4 or above: No action needed, pain <4.

## 2022-10-16 DIAGNOSIS — M25511 Pain in right shoulder: Secondary | ICD-10-CM | POA: Diagnosis not present

## 2022-10-30 DIAGNOSIS — C4441 Basal cell carcinoma of skin of scalp and neck: Secondary | ICD-10-CM | POA: Diagnosis not present

## 2022-10-30 DIAGNOSIS — L659 Nonscarring hair loss, unspecified: Secondary | ICD-10-CM | POA: Diagnosis not present

## 2022-11-18 ENCOUNTER — Other Ambulatory Visit: Payer: Self-pay | Admitting: Obstetrics and Gynecology

## 2022-11-18 DIAGNOSIS — Z1231 Encounter for screening mammogram for malignant neoplasm of breast: Secondary | ICD-10-CM

## 2022-11-21 DIAGNOSIS — H2513 Age-related nuclear cataract, bilateral: Secondary | ICD-10-CM | POA: Diagnosis not present

## 2022-11-21 DIAGNOSIS — Z79899 Other long term (current) drug therapy: Secondary | ICD-10-CM | POA: Diagnosis not present

## 2022-11-21 DIAGNOSIS — H40013 Open angle with borderline findings, low risk, bilateral: Secondary | ICD-10-CM | POA: Diagnosis not present

## 2022-11-21 DIAGNOSIS — H17823 Peripheral opacity of cornea, bilateral: Secondary | ICD-10-CM | POA: Diagnosis not present

## 2022-11-27 DIAGNOSIS — M25511 Pain in right shoulder: Secondary | ICD-10-CM | POA: Diagnosis not present

## 2022-12-10 DIAGNOSIS — M25511 Pain in right shoulder: Secondary | ICD-10-CM | POA: Diagnosis not present

## 2022-12-21 DIAGNOSIS — M25511 Pain in right shoulder: Secondary | ICD-10-CM | POA: Diagnosis not present

## 2022-12-30 DIAGNOSIS — M25511 Pain in right shoulder: Secondary | ICD-10-CM | POA: Diagnosis not present

## 2022-12-31 ENCOUNTER — Ambulatory Visit
Admission: RE | Admit: 2022-12-31 | Discharge: 2022-12-31 | Disposition: A | Discharge status: Home / Self Care | Payer: BC Managed Care – PPO | Source: Ambulatory Visit | Attending: Obstetrics and Gynecology | Admitting: Obstetrics and Gynecology

## 2022-12-31 DIAGNOSIS — Z1231 Encounter for screening mammogram for malignant neoplasm of breast: Secondary | ICD-10-CM

## 2023-01-01 DIAGNOSIS — C4441 Basal cell carcinoma of skin of scalp and neck: Secondary | ICD-10-CM | POA: Diagnosis not present

## 2023-01-01 DIAGNOSIS — L659 Nonscarring hair loss, unspecified: Secondary | ICD-10-CM | POA: Diagnosis not present

## 2023-01-27 DIAGNOSIS — N760 Acute vaginitis: Secondary | ICD-10-CM | POA: Diagnosis not present

## 2023-01-27 DIAGNOSIS — Z01419 Encounter for gynecological examination (general) (routine) without abnormal findings: Secondary | ICD-10-CM | POA: Diagnosis not present

## 2023-01-27 DIAGNOSIS — Z6831 Body mass index (BMI) 31.0-31.9, adult: Secondary | ICD-10-CM | POA: Diagnosis not present

## 2023-01-27 DIAGNOSIS — Z124 Encounter for screening for malignant neoplasm of cervix: Secondary | ICD-10-CM | POA: Diagnosis not present

## 2023-02-13 DIAGNOSIS — M25511 Pain in right shoulder: Secondary | ICD-10-CM | POA: Diagnosis not present

## 2023-03-05 DIAGNOSIS — L659 Nonscarring hair loss, unspecified: Secondary | ICD-10-CM | POA: Diagnosis not present

## 2023-03-05 DIAGNOSIS — C4441 Basal cell carcinoma of skin of scalp and neck: Secondary | ICD-10-CM | POA: Diagnosis not present

## 2023-03-05 DIAGNOSIS — Z79899 Other long term (current) drug therapy: Secondary | ICD-10-CM | POA: Diagnosis not present

## 2023-03-09 DIAGNOSIS — R0981 Nasal congestion: Secondary | ICD-10-CM | POA: Diagnosis not present

## 2023-03-09 DIAGNOSIS — R509 Fever, unspecified: Secondary | ICD-10-CM | POA: Diagnosis not present

## 2023-03-09 DIAGNOSIS — R051 Acute cough: Secondary | ICD-10-CM | POA: Diagnosis not present

## 2023-03-09 DIAGNOSIS — M791 Myalgia, unspecified site: Secondary | ICD-10-CM | POA: Diagnosis not present

## 2023-04-02 DIAGNOSIS — M25511 Pain in right shoulder: Secondary | ICD-10-CM | POA: Diagnosis not present

## 2023-05-11 DIAGNOSIS — L570 Actinic keratosis: Secondary | ICD-10-CM | POA: Diagnosis not present

## 2023-05-11 DIAGNOSIS — C44519 Basal cell carcinoma of skin of other part of trunk: Secondary | ICD-10-CM | POA: Diagnosis not present

## 2023-05-11 DIAGNOSIS — C4441 Basal cell carcinoma of skin of scalp and neck: Secondary | ICD-10-CM | POA: Diagnosis not present

## 2023-05-11 DIAGNOSIS — C44619 Basal cell carcinoma of skin of left upper limb, including shoulder: Secondary | ICD-10-CM | POA: Diagnosis not present

## 2023-05-11 DIAGNOSIS — R6252 Short stature (child): Secondary | ICD-10-CM | POA: Diagnosis not present

## 2023-05-11 DIAGNOSIS — Z85828 Personal history of other malignant neoplasm of skin: Secondary | ICD-10-CM | POA: Diagnosis not present

## 2023-05-11 DIAGNOSIS — L2089 Other atopic dermatitis: Secondary | ICD-10-CM | POA: Diagnosis not present

## 2023-05-11 DIAGNOSIS — D2362 Other benign neoplasm of skin of left upper limb, including shoulder: Secondary | ICD-10-CM | POA: Diagnosis not present

## 2023-05-28 DIAGNOSIS — M25511 Pain in right shoulder: Secondary | ICD-10-CM | POA: Diagnosis not present

## 2023-06-04 DIAGNOSIS — C4441 Basal cell carcinoma of skin of scalp and neck: Secondary | ICD-10-CM | POA: Diagnosis not present

## 2023-06-22 DIAGNOSIS — M7541 Impingement syndrome of right shoulder: Secondary | ICD-10-CM | POA: Diagnosis not present

## 2023-06-22 DIAGNOSIS — X58XXXA Exposure to other specified factors, initial encounter: Secondary | ICD-10-CM | POA: Diagnosis not present

## 2023-06-22 DIAGNOSIS — G8918 Other acute postprocedural pain: Secondary | ICD-10-CM | POA: Diagnosis not present

## 2023-06-22 DIAGNOSIS — M7551 Bursitis of right shoulder: Secondary | ICD-10-CM | POA: Diagnosis not present

## 2023-06-22 DIAGNOSIS — M19011 Primary osteoarthritis, right shoulder: Secondary | ICD-10-CM | POA: Diagnosis not present

## 2023-06-22 DIAGNOSIS — Y999 Unspecified external cause status: Secondary | ICD-10-CM | POA: Diagnosis not present

## 2023-06-22 DIAGNOSIS — S43431A Superior glenoid labrum lesion of right shoulder, initial encounter: Secondary | ICD-10-CM | POA: Diagnosis not present

## 2023-06-29 DIAGNOSIS — M25611 Stiffness of right shoulder, not elsewhere classified: Secondary | ICD-10-CM | POA: Diagnosis not present

## 2023-06-29 DIAGNOSIS — M25511 Pain in right shoulder: Secondary | ICD-10-CM | POA: Diagnosis not present

## 2023-07-13 DIAGNOSIS — M25611 Stiffness of right shoulder, not elsewhere classified: Secondary | ICD-10-CM | POA: Diagnosis not present

## 2023-07-13 DIAGNOSIS — M25511 Pain in right shoulder: Secondary | ICD-10-CM | POA: Diagnosis not present

## 2023-09-03 DIAGNOSIS — C4441 Basal cell carcinoma of skin of scalp and neck: Secondary | ICD-10-CM | POA: Diagnosis not present

## 2023-09-03 DIAGNOSIS — Z79899 Other long term (current) drug therapy: Secondary | ICD-10-CM | POA: Diagnosis not present

## 2023-09-03 DIAGNOSIS — L659 Nonscarring hair loss, unspecified: Secondary | ICD-10-CM | POA: Diagnosis not present

## 2023-10-28 ENCOUNTER — Other Ambulatory Visit: Payer: Self-pay | Admitting: Obstetrics and Gynecology

## 2023-10-28 DIAGNOSIS — Z1231 Encounter for screening mammogram for malignant neoplasm of breast: Secondary | ICD-10-CM

## 2023-11-25 DIAGNOSIS — H40013 Open angle with borderline findings, low risk, bilateral: Secondary | ICD-10-CM | POA: Diagnosis not present

## 2023-11-25 DIAGNOSIS — H2513 Age-related nuclear cataract, bilateral: Secondary | ICD-10-CM | POA: Diagnosis not present

## 2023-11-25 DIAGNOSIS — H5213 Myopia, bilateral: Secondary | ICD-10-CM | POA: Diagnosis not present

## 2023-11-25 DIAGNOSIS — H524 Presbyopia: Secondary | ICD-10-CM | POA: Diagnosis not present

## 2023-11-25 DIAGNOSIS — Z79899 Other long term (current) drug therapy: Secondary | ICD-10-CM | POA: Diagnosis not present

## 2023-11-25 DIAGNOSIS — H17823 Peripheral opacity of cornea, bilateral: Secondary | ICD-10-CM | POA: Diagnosis not present

## 2023-11-25 DIAGNOSIS — H52223 Regular astigmatism, bilateral: Secondary | ICD-10-CM | POA: Diagnosis not present

## 2023-12-08 ENCOUNTER — Other Ambulatory Visit: Payer: Self-pay | Admitting: Family

## 2023-12-08 ENCOUNTER — Ambulatory Visit: Admitting: Family

## 2023-12-08 VITALS — BP 124/78 | HR 92 | Ht 66.0 in | Wt 192.4 lb

## 2023-12-08 DIAGNOSIS — I10 Essential (primary) hypertension: Secondary | ICD-10-CM

## 2023-12-08 DIAGNOSIS — F17290 Nicotine dependence, other tobacco product, uncomplicated: Secondary | ICD-10-CM | POA: Diagnosis not present

## 2023-12-08 DIAGNOSIS — L989 Disorder of the skin and subcutaneous tissue, unspecified: Secondary | ICD-10-CM | POA: Diagnosis not present

## 2023-12-08 DIAGNOSIS — R7309 Other abnormal glucose: Secondary | ICD-10-CM

## 2023-12-08 DIAGNOSIS — C4441 Basal cell carcinoma of skin of scalp and neck: Secondary | ICD-10-CM

## 2023-12-08 DIAGNOSIS — K219 Gastro-esophageal reflux disease without esophagitis: Secondary | ICD-10-CM

## 2023-12-08 DIAGNOSIS — Z840 Family history of diseases of the skin and subcutaneous tissue: Secondary | ICD-10-CM

## 2023-12-08 MED ORDER — PANTOPRAZOLE SODIUM 40 MG PO TBEC: 40.0000 mg | DELAYED_RELEASE_TABLET | Freq: Every day | ORAL | 3 refills | Status: AC

## 2023-12-08 MED ORDER — OLMESARTAN MEDOXOMIL 20 MG PO TABS: 20.0000 mg | ORAL_TABLET | Freq: Every day | ORAL | 3 refills | Status: AC

## 2023-12-08 NOTE — Progress Notes (Signed)
 Amanda Stokes is a 48 y.o. 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 500mg      doxycycline (VIBRAMYCIN) 50 MG capsule Take 50 mg by mouth daily.     minoxidil (LONITEN) 2.5 MG tablet Take by mouth daily.     PHENTERMINE HCL PO Take by mouth.     betamethasone  dipropionate 0.05 % lotion APPLY LIBERALLY TO AFFECTED AREA ONCE A DAY     imiquimod (ALDARA) 5 % cream Apply topically 3 (three) times a week. (Patient not taking: Reported on 12/08/2023)     olmesartan  (BENICAR ) 20 MG tablet Take 1 tablet (20 mg total) by mouth daily. 90 tablet 3   pantoprazole  (PROTONIX ) 40 MG tablet Take 1 tablet (40 mg total) by mouth daily. 90 tablet 3   No current facility-administered medications for this visit.    Allergies: Hydrocodone -acetaminophen , Varenicline, and Vicodin [hydrocodone -acetaminophen ]  Past Medical History:  Diagnosis Date   Elevated LFTs    GERD (gastroesophageal reflux disease)    Hypertension    Polycystic ovarian syndrome     Past Surgical History:  Procedure Laterality Date   ABDOMINAL HYSTERECTOMY  05/2010   partial   CARPAL TUNNEL RELEASE Left 10/03/2021   Procedure: CARPAL TUNNEL  RELEASE left;  Surgeon: Ltanya Rummer, MD;  Location: [facility];  Service: Orthopedics;  Laterality: Left;  with local anesthesia   CESAREAN SECTION     CHOLECYSTECTOMY  06/10/11    lap chole    PARTIAL HYSTERECTOMY      Family History  Problem Relation Age of Onset   Heart disease Father    Diabetes Maternal Grandmother        also Maternal Grandfather   Heart disease Maternal Grandfather        and Father   Diabetes Maternal Grandfather    Hypertension Maternal Grandfather    Stroke Maternal Grandfather    Kidney disease Paternal Grandmother    Thyroid  disease Neg Hx    Colon cancer Neg Hx    Colon polyps Neg Hx    Esophageal cancer Neg Hx    Rectal cancer Neg Hx    Stomach cancer Neg Hx     Social History   Tobacco Use   Smoking status: Former    Current packs/day: 0.00    Types: Cigarettes    Quit date: 06/01/2009    Years since quitting: 14.5   Smokeless tobacco: Never  Substance Use Topics   Alcohol  use: No    Subjective:   Presents today as a new patient- history of GERD and HTN; stable on current medications; Denies any chest pain, shortness of breath, blurred vision or headache  Works with [employer] [facility] [employer] for GYN needs- Dr. Jesusita Morris; Sees dermatology very regularly- history of basal cell cancer; is concerned  about changing lesion on her forehead- was told she needed to see plastic surgeon and would like referral updated if possible;  Does use e-cigarettes but notes she is in the process of tapering off with the plan to quit completely;    Objective:  Vitals:   12/08/23 1437  BP: 124/78  Pulse: 92  SpO2: 100%  Weight: 192 lb 6.4 oz (87.3 kg)  Height: 5\' 6"  (1.676 m)    General: Well developed, well nourished, in no acute distress  Skin : Warm and dry. Cystic lesion noted on left upper forehead Head: Normocephalic and atraumatic  Eyes: Sclera and conjunctiva clear; pupils round and reactive to light; extraocular movements intact   Ears: External normal; canals clear; tympanic membranes normal  Oropharynx: Pink, supple. No suspicious lesions  Neck: Supple without thyromegaly, adenopathy  Lungs: Respirations unlabored; clear to auscultation bilaterally without wheeze, rales, rhonchi  CVS exam: normal rate and regular rhythm.  Neurologic: Alert and oriented; speech intact; face symmetrical; moves all extremities well; CNII-XII intact without focal deficit   Assessment:  1. Facial lesion   2. Essential hypertension, benign   3. Other tobacco product nicotine dependence, uncomplicated   4. Basal cell carcinoma of skin of scalp and neck   5. Gastroesophageal reflux disease, unspecified whether esophagitis present   6. FH: lupus erythematosus     Plan:  Refer to plastic surgery; Stable; refill updated; Stressed need to taper off e-cigs completely; Continue with dermatology regularly as scheduled; Stable; refill updated; Will check ANA at future labs; patient has never had to see rheumatology;   Return for follow up for fasting labs.  Orders Placed This Encounter  Procedures   Ambulatory referral to Plastic Surgery    Referral Priority:   Routine    Referral Type:   Surgical    Referral Reason:   Specialty Services Required    Requested Specialty:   Plastic Surgery    Number of Visits Requested:   1    Requested Prescriptions   Signed Prescriptions Disp Refills   pantoprazole  (PROTONIX ) 40 MG tablet 90 tablet 3    Sig: Take 1 tablet (40 mg total) by mouth daily.   olmesartan  (BENICAR ) 20 MG tablet 90 tablet 3    Sig: Take 1 tablet (20 mg total) by mouth daily.

## 2023-12-14 ENCOUNTER — Other Ambulatory Visit (INDEPENDENT_AMBULATORY_CARE_PROVIDER_SITE_OTHER)

## 2023-12-14 DIAGNOSIS — Z840 Family history of diseases of the skin and subcutaneous tissue: Secondary | ICD-10-CM

## 2023-12-14 DIAGNOSIS — I10 Essential (primary) hypertension: Secondary | ICD-10-CM

## 2023-12-14 DIAGNOSIS — R7309 Other abnormal glucose: Secondary | ICD-10-CM | POA: Diagnosis not present

## 2023-12-14 DIAGNOSIS — K219 Gastro-esophageal reflux disease without esophagitis: Secondary | ICD-10-CM | POA: Diagnosis not present

## 2023-12-14 LAB — CBC WITH DIFFERENTIAL/PLATELET
Basophils Absolute: 0.1 10*3/uL (ref 0.0–0.1)
Basophils Relative: 1.8 % (ref 0.0–3.0)
Eosinophils Absolute: 0.2 10*3/uL (ref 0.0–0.7)
Eosinophils Relative: 3 % (ref 0.0–5.0)
HCT: 40.8 % (ref 36.0–46.0)
Hemoglobin: 13.3 g/dL (ref 12.0–15.0)
Lymphocytes Relative: 38.7 % (ref 12.0–46.0)
Lymphs Abs: 2 10*3/uL (ref 0.7–4.0)
MCHC: 32.7 g/dL (ref 30.0–36.0)
MCV: 90.4 fl (ref 78.0–100.0)
Monocytes Absolute: 0.4 10*3/uL (ref 0.1–1.0)
Monocytes Relative: 8.3 % (ref 3.0–12.0)
Neutro Abs: 2.5 10*3/uL (ref 1.4–7.7)
Neutrophils Relative %: 48.2 % (ref 43.0–77.0)
Platelets: 286 10*3/uL (ref 150.0–400.0)
RBC: 4.52 Mil/uL (ref 3.87–5.11)
RDW: 13.6 % (ref 11.5–15.5)
WBC: 5.2 10*3/uL (ref 4.0–10.5)

## 2023-12-14 LAB — COMPREHENSIVE METABOLIC PANEL WITH GFR
ALT: 54 U/L — ABNORMAL HIGH (ref 0–35)
AST: 27 U/L (ref 0–37)
Albumin: 4.4 g/dL (ref 3.5–5.2)
Alkaline Phosphatase: 51 U/L (ref 39–117)
BUN: 12 mg/dL (ref 6–23)
CO2: 26 meq/L (ref 19–32)
Calcium: 9.3 mg/dL (ref 8.4–10.5)
Chloride: 103 meq/L (ref 96–112)
Creatinine, Ser: 0.82 mg/dL (ref 0.40–1.20)
GFR: 85.74 mL/min (ref >60.00)
Glucose, Bld: 103 mg/dL — ABNORMAL HIGH (ref 70–99)
Potassium: 4.3 meq/L (ref 3.5–5.1)
Sodium: 139 meq/L (ref 135–145)
Total Bilirubin: 1 mg/dL (ref 0.2–1.2)
Total Protein: 6.7 g/dL (ref 6.0–8.3)

## 2023-12-14 LAB — LIPID PANEL
Cholesterol: 177 mg/dL (ref 0–200)
HDL: 47.1 mg/dL (ref >39.00)
LDL Cholesterol: 103 mg/dL — ABNORMAL HIGH (ref 0–99)
NonHDL: 129.41
Total CHOL/HDL Ratio: 4
Triglycerides: 131 mg/dL (ref 0.0–149.0)
VLDL: 26.2 mg/dL (ref 0.0–40.0)

## 2023-12-14 LAB — HEMOGLOBIN A1C: Hgb A1c MFr Bld: 5.6 % (ref 4.6–6.5)

## 2023-12-15 LAB — ANA: Anti Nuclear Antibody (ANA): NEGATIVE

## 2023-12-16 ENCOUNTER — Other Ambulatory Visit: Payer: Self-pay | Admitting: Family

## 2023-12-16 ENCOUNTER — Encounter: Payer: Self-pay | Admitting: Family

## 2023-12-16 DIAGNOSIS — R7989 Other specified abnormal findings of blood chemistry: Secondary | ICD-10-CM

## 2023-12-28 ENCOUNTER — Encounter (HOSPITAL_COMMUNITY): Payer: Self-pay

## 2023-12-30 ENCOUNTER — Encounter: Payer: Self-pay | Admitting: Plastic Surgery

## 2023-12-30 ENCOUNTER — Ambulatory Visit: Admitting: Plastic Surgery

## 2023-12-30 VITALS — BP 107/75 | HR 86 | Ht 66.0 in | Wt 187.2 lb

## 2023-12-30 DIAGNOSIS — D489 Neoplasm of uncertain behavior, unspecified: Secondary | ICD-10-CM

## 2023-12-30 DIAGNOSIS — R220 Localized swelling, mass and lump, head: Secondary | ICD-10-CM | POA: Diagnosis not present

## 2023-12-30 NOTE — Progress Notes (Signed)
 Referring Provider Adra Alanis, FNP [address] [place],  [place] [address]   CC:  Chief Complaint  Patient presents with   consult      Amanda Stokes is an 48 y.o. female.  HPI: Amanda Stokes is a 48 year old female who presents today with complaint of a mass on the left side of her forehead.  This mass has been present for approximately 6 years but has been growing in size slowly.  She states she does have occasional headaches and she feels that this may be associated with the headaches.  She states that her dermatologist did an x-ray of the area and was told that there was consistent with a cyst.  She would like to have the mass removed.  Allergies  Allergen Reactions   Hydrocodone -Acetaminophen  Itching   Varenicline Nausea And Vomiting   Vicodin [Hydrocodone -Acetaminophen ] Itching    Outpatient Encounter Medications as of 12/30/2023  Medication Sig   doxycycline (VIBRAMYCIN) 50 MG capsule Take 50 mg by mouth daily.   olmesartan  (BENICAR ) 20 MG tablet Take 1 tablet (20 mg total) by mouth daily.   pantoprazole  (PROTONIX ) 40 MG tablet Take 1 tablet (40 mg total) by mouth daily.   PHENTERMINE HCL PO Take by mouth.   betamethasone  dipropionate 0.05 % lotion APPLY LIBERALLY TO AFFECTED AREA ONCE A DAY   diphenhydramine-acetaminophen  (TYLENOL  PM) 25-500 MG TABS tablet Take 1 tablet by mouth at bedtime as needed. 500mg    imiquimod (ALDARA) 5 % cream Apply topically 3 (three) times a week. (Patient not taking: Reported on 12/30/2023)   minoxidil (LONITEN) 2.5 MG tablet Take by mouth daily. (Patient not taking: Reported on 12/30/2023)   No facility-administered encounter medications on file as of 12/30/2023.     Past Medical History:  Diagnosis Date   Elevated LFTs    GERD (gastroesophageal reflux disease)    Hypertension    Polycystic ovarian syndrome     Past Surgical History:  Procedure Laterality Date   ABDOMINAL HYSTERECTOMY  05/2010   partial    CARPAL TUNNEL RELEASE Left 10/03/2021   Procedure: CARPAL TUNNEL RELEASE left;  Surgeon: Ltanya Rummer, MD;  Location: [facility];  Service: Orthopedics;  Laterality: Left;  with local anesthesia   CESAREAN SECTION     CHOLECYSTECTOMY  06/10/11    lap chole    PARTIAL HYSTERECTOMY      Family History  Problem Relation Age of Onset   Heart disease Father    Diabetes Maternal Grandmother        also Maternal Grandfather   Heart disease Maternal Grandfather        and Father   Diabetes Maternal Grandfather    Hypertension Maternal Grandfather    Stroke Maternal Grandfather    Kidney disease Paternal Grandmother    Thyroid  disease Neg Hx    Colon cancer Neg Hx    Colon polyps Neg Hx    Esophageal cancer Neg Hx    Rectal cancer Neg Hx    Stomach cancer Neg Hx     Social History   Social History Narrative   Not on file     Review of Systems General: Denies fevers, chills, weight loss CV: Denies chest pain, shortness of breath, palpitations Forehead: Mass on the left side of the forehead.  Occasionally tender.  Occasionally associated with headaches.  Has been slowly growing  Physical Exam    12/30/2023    1:35 PM 12/08/2023    2:37  PM 10/14/2022   11:07 AM  Vitals with BMI  Height 5\' 6"  5\' 6"    Weight 187 lbs 3 oz 192 lbs 6 oz   BMI 30.23 31.07   Systolic 107 124 829  Diastolic 75 78 79  Pulse 86 92 68    General:  No acute distress,  Alert and oriented, Non-Toxic, Normal speech and affect Forehead: There is a 1 cm mass on the left side of the forehead possibly under the frontalis muscle.  It is nontender to palpation.  It is soft. 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  We discussed removal.  She feels that she would be able to do this in the office.  I showed her where the incision would be placed and that I would try to hide it in one of her forehead creases.  She  understands there will be a scar.  I will close the incision with permanent sutures which will need to be removed 5 to 7 days after surgery.  All questions were answered to her satisfaction.  Photographs were obtained with her consent.  Will schedule her for removal of the mass in the office at her request.  [name] 12/30/2023, 1:58 PM

## 2024-01-04 ENCOUNTER — Ambulatory Visit
Admission: RE | Admit: 2024-01-04 | Discharge: 2024-01-04 | Disposition: A | Discharge status: Home / Self Care | Source: Ambulatory Visit | Attending: Obstetrics and Gynecology | Admitting: Obstetrics and Gynecology

## 2024-01-04 DIAGNOSIS — Z1231 Encounter for screening mammogram for malignant neoplasm of breast: Secondary | ICD-10-CM | POA: Diagnosis not present

## 2024-01-27 ENCOUNTER — Ambulatory Visit: Admitting: Plastic Surgery

## 2024-02-08 ENCOUNTER — Ambulatory Visit: Admitting: Plastic Surgery

## 2024-02-08 VITALS — BP 111/73 | HR 77

## 2024-02-08 DIAGNOSIS — D170 Benign lipomatous neoplasm of skin and subcutaneous tissue of head, face and neck: Secondary | ICD-10-CM | POA: Diagnosis not present

## 2024-02-08 DIAGNOSIS — D489 Neoplasm of uncertain behavior, unspecified: Secondary | ICD-10-CM

## 2024-02-08 NOTE — Progress Notes (Signed)
 Procedure Note  Preoperative Dx: Forehead mass  Postoperative Dx: Same  Procedure: Excision of forehead mass  Anesthesia: Lidocaine  1% with 1:100,000 epinephrine and 0.25% Sensorcaine    Indication for Procedure: Removal for pathologic diagnosis  Description of Procedure: Risks and complications were explained to the patient including the possibility of recurrence and need for additional procedures.  Consent was confirmed and the patient understands the risks and benefits.  The potential complications and alternatives were explained and the patient consents.  The patient expressed understanding the option of not having the procedure and the risks of a scar.  Time out was called and all information was confirmed to be correct.    The area was prepped and drapped.  Local anesthetic was injected in the subcutaneous tissues.  After waiting for the local to take affect a 2 cm incision was made over the mass in the forehead crease.  The mass was located below the frontalis muscle mass of the frontalis muscle was divided at its lateral border.  The mass was identified and removed without difficulty..  After obtaining hemostasis, the surgical wound was closed with a 4-0 Monocryl in the frontalis muscle a 4-0 Monocryl in the deep subcutaneous tissues and interrupted 5-0 Prolene sutures in the skin.  The surgical wound measured 2 cm.  A dressing was applied.  The patient was given instructions on how to care for the area and a follow up appointment.  Amanda Stokes tolerated the procedure well and there were no complications. The specimen was sent to pathology.

## 2024-02-09 LAB — DERMATOLOGY PATHOLOGY

## 2024-02-11 NOTE — Progress Notes (Signed)
 Patient is a pleasant 48 year old female s/p excision of forehead mass performed 02/08/2024 in office by Dr. Waddell who presents to clinic for postprocedural follow-up.  Reviewed procedural note and the excision site was closed with 5-0 Prolene.  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  Today, she is doing well.  No specific concerns or complaints.  She feels prepared for suture removal.  A total of 6 simple interrupted Prolene sutures were removed without complication or difficulty.  The skin edges remained well-approximated.  The excision site is concealed nicely within a static rhytid.  No palpable underlying fluid collection.  Discussed Vaseline to the excision site for the next week and then transition to silicone scar gel twice daily x 3 months.  Would also recommend beginning mechanical massage of the area in 1 week.  Avoid excessive UV exposure and protect with sunscreen.  No specific follow-up needed, patient to call the office should she have questions or concerns.

## 2024-02-15 ENCOUNTER — Ambulatory Visit (INDEPENDENT_AMBULATORY_CARE_PROVIDER_SITE_OTHER): Payer: Self-pay | Admitting: Physician Assistant

## 2024-02-15 DIAGNOSIS — Z719 Counseling, unspecified: Secondary | ICD-10-CM

## 2024-02-15 DIAGNOSIS — D170 Benign lipomatous neoplasm of skin and subcutaneous tissue of head, face and neck: Secondary | ICD-10-CM

## 2024-03-28 DIAGNOSIS — H2513 Age-related nuclear cataract, bilateral: Secondary | ICD-10-CM | POA: Diagnosis not present

## 2024-03-28 DIAGNOSIS — H17823 Peripheral opacity of cornea, bilateral: Secondary | ICD-10-CM | POA: Diagnosis not present

## 2024-03-28 DIAGNOSIS — H40013 Open angle with borderline findings, low risk, bilateral: Secondary | ICD-10-CM | POA: Diagnosis not present

## 2024-04-04 DIAGNOSIS — Z01419 Encounter for gynecological examination (general) (routine) without abnormal findings: Secondary | ICD-10-CM | POA: Diagnosis not present

## 2024-04-04 DIAGNOSIS — Z6831 Body mass index (BMI) 31.0-31.9, adult: Secondary | ICD-10-CM | POA: Diagnosis not present

## 2024-05-31 DIAGNOSIS — D2361 Other benign neoplasm of skin of right upper limb, including shoulder: Secondary | ICD-10-CM | POA: Diagnosis not present

## 2024-05-31 DIAGNOSIS — Z85828 Personal history of other malignant neoplasm of skin: Secondary | ICD-10-CM | POA: Diagnosis not present

## 2024-05-31 DIAGNOSIS — D225 Melanocytic nevi of trunk: Secondary | ICD-10-CM | POA: Diagnosis not present

## 2024-05-31 DIAGNOSIS — L6611 Classic lichen planopilaris: Secondary | ICD-10-CM | POA: Diagnosis not present

## 2024-05-31 DIAGNOSIS — L570 Actinic keratosis: Secondary | ICD-10-CM | POA: Diagnosis not present
